# Patient Record
Sex: Female | Born: 1985 | Race: White | Hispanic: No | Marital: Married | Smoking: Never smoker
Health system: Southern US, Community
[De-identification: ages and names within clinical notes are randomized; demographics above are authoritative.]

## PROBLEM LIST (undated history)

## (undated) ENCOUNTER — Emergency Department (HOSPITAL_COMMUNITY): Admission: EM | Discharge status: Against Medical Advice | Payer: Self-pay

## (undated) ENCOUNTER — Inpatient Hospital Stay (HOSPITAL_COMMUNITY): Payer: Self-pay

## (undated) DIAGNOSIS — J45909 Unspecified asthma, uncomplicated: Secondary | ICD-10-CM

## (undated) DIAGNOSIS — O30043 Twin pregnancy, dichorionic/diamniotic, third trimester: Secondary | ICD-10-CM

## (undated) DIAGNOSIS — I10 Essential (primary) hypertension: Secondary | ICD-10-CM

## (undated) DIAGNOSIS — F419 Anxiety disorder, unspecified: Secondary | ICD-10-CM

## (undated) DIAGNOSIS — O09299 Supervision of pregnancy with other poor reproductive or obstetric history, unspecified trimester: Secondary | ICD-10-CM

## (undated) DIAGNOSIS — F32A Depression, unspecified: Secondary | ICD-10-CM

## (undated) DIAGNOSIS — F329 Major depressive disorder, single episode, unspecified: Secondary | ICD-10-CM

## (undated) DIAGNOSIS — Z98891 History of uterine scar from previous surgery: Secondary | ICD-10-CM

## (undated) DIAGNOSIS — R519 Headache, unspecified: Secondary | ICD-10-CM

## (undated) HISTORY — PX: WISDOM TOOTH EXTRACTION: SHX21

---

## 2008-09-07 ENCOUNTER — Ambulatory Visit: Payer: Self-pay | Admitting: Diagnostic Radiology

## 2008-09-07 ENCOUNTER — Ambulatory Visit (HOSPITAL_BASED_OUTPATIENT_CLINIC_OR_DEPARTMENT_OTHER): Admission: RE | Admit: 2008-09-07 | Discharge: 2008-09-07 | Payer: Self-pay | Admitting: Family Medicine

## 2009-04-29 HISTORY — PX: KNEE ARTHROSCOPY W/ MENISCAL REPAIR: SHX1877

## 2009-08-03 ENCOUNTER — Encounter: Admission: RE | Admit: 2009-08-03 | Discharge: 2009-08-28 | Payer: Self-pay | Admitting: Orthopaedic Surgery

## 2009-11-01 ENCOUNTER — Encounter: Admission: RE | Admit: 2009-11-01 | Discharge: 2009-12-08 | Payer: Self-pay | Admitting: Orthopaedic Surgery

## 2009-12-15 IMAGING — US US ABDOMEN COMPLETE
1 series · 14 of 25 positions shown · non-contrast
Comparison: None

CLINICAL DATA: Abdominal pain.  Postprandial.  Evaluate for
gallstones.

COMPLETE ABDOMINAL ULTRASOUND

[Series 1: us abdomen complete · 0.30mm/px · 14 of 68 slices shown]
[im 1/68]
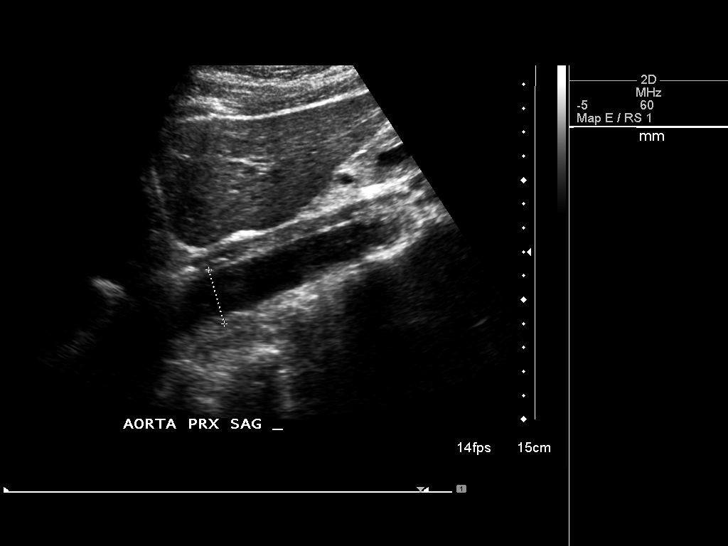
[im 6/68]
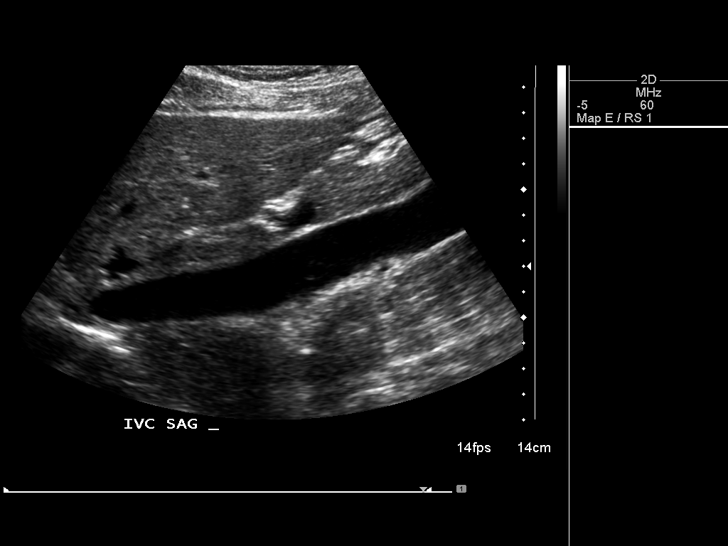
[im 12/68]
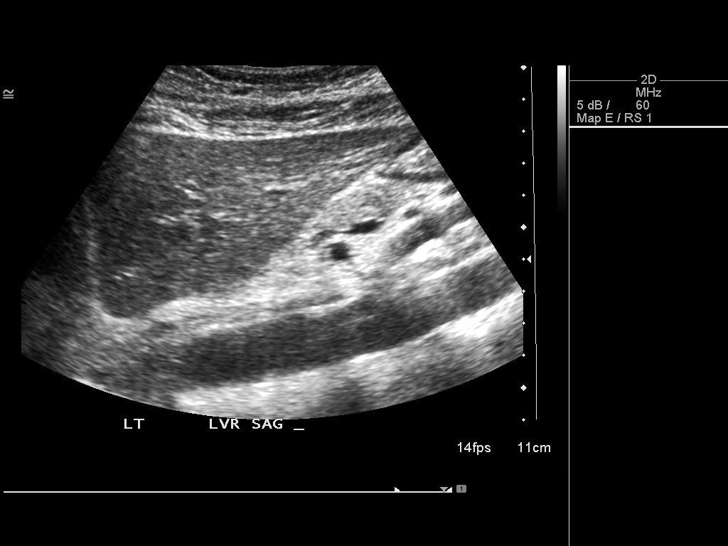
[im 17/68]
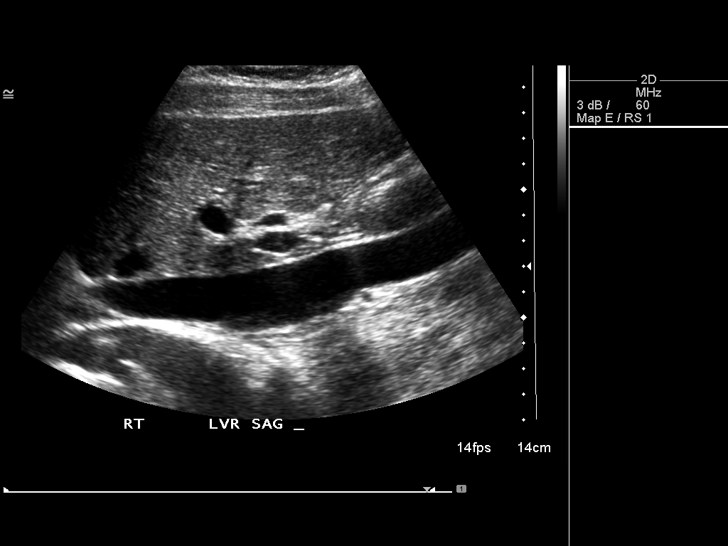
[im 23/68]
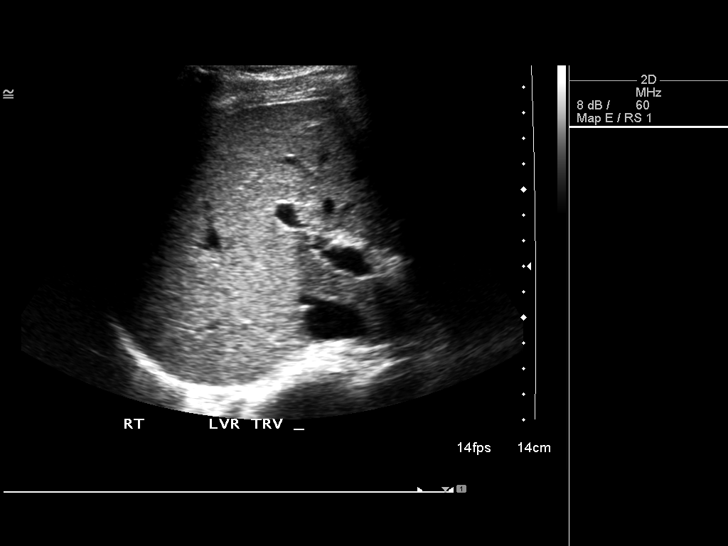
[im 26/68]
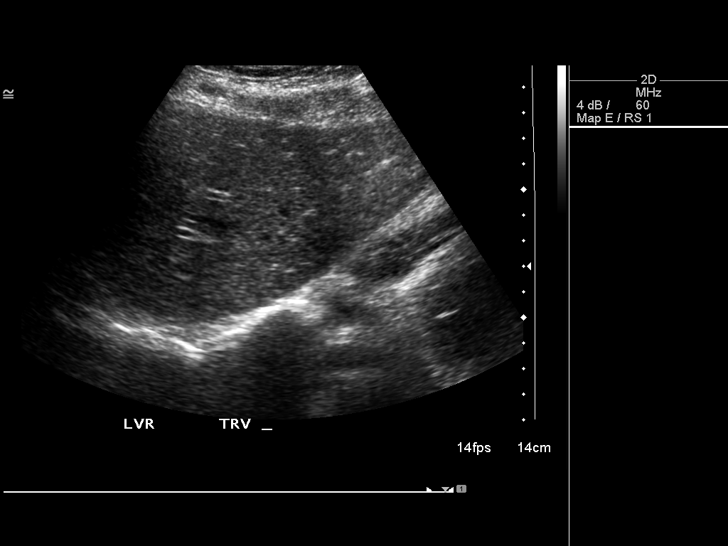
[im 31/68]
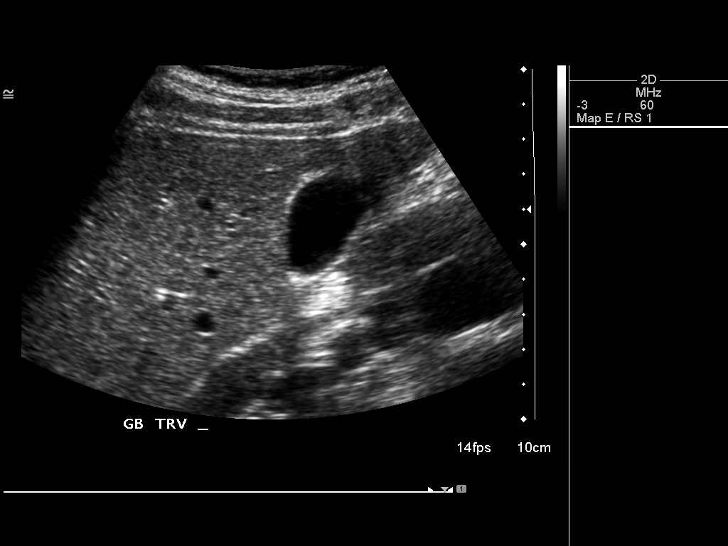
[im 37/68]
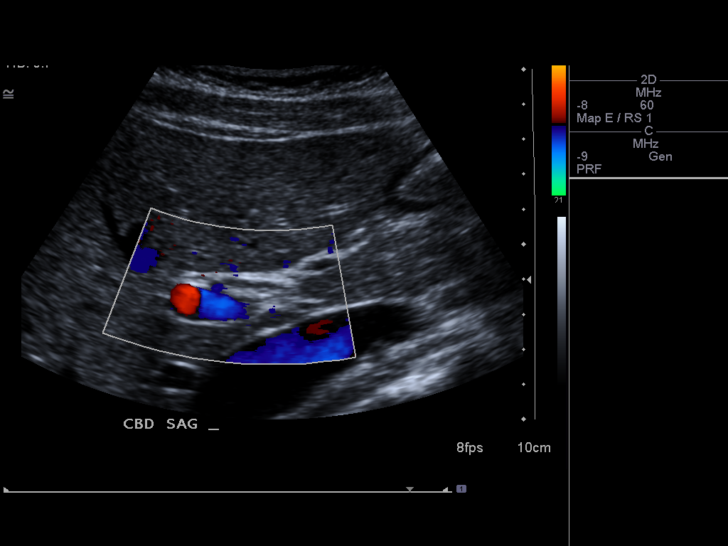
[im 42/68]
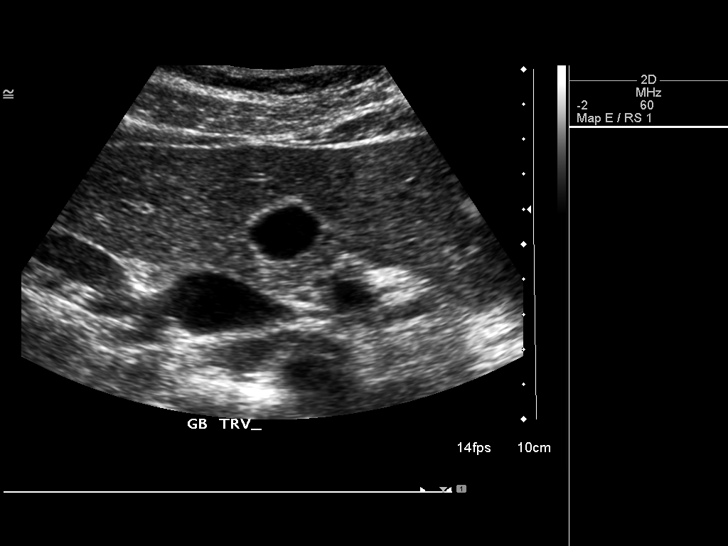
[im 45/68]
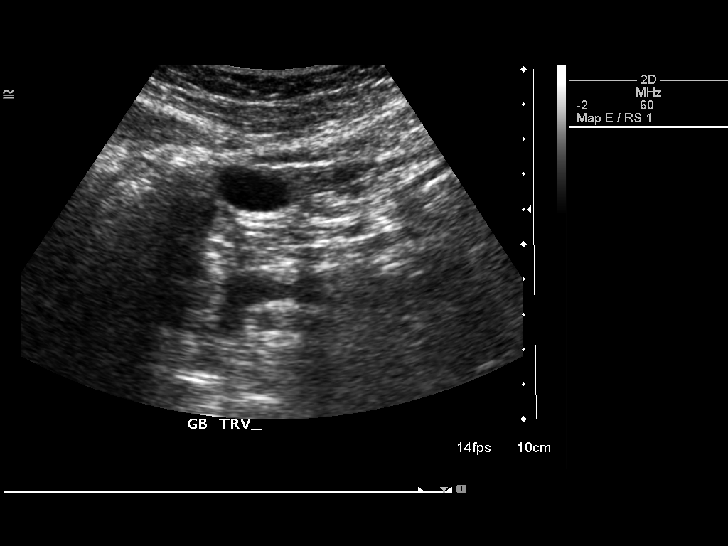
[im 51/68]
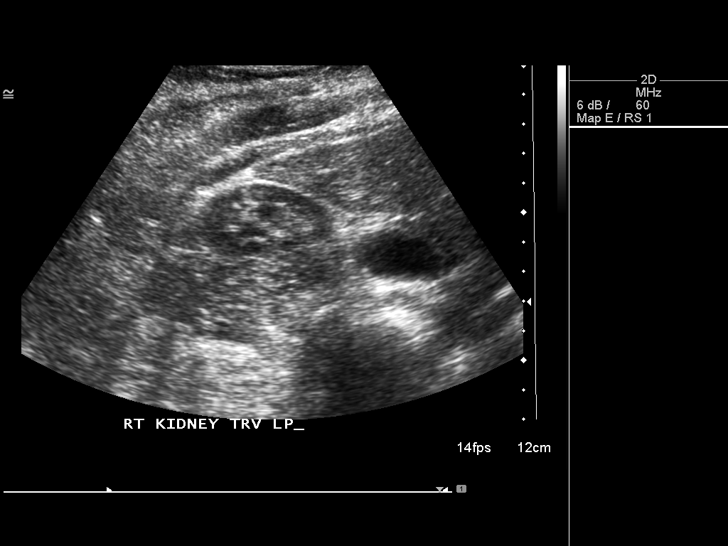
[im 56/68]
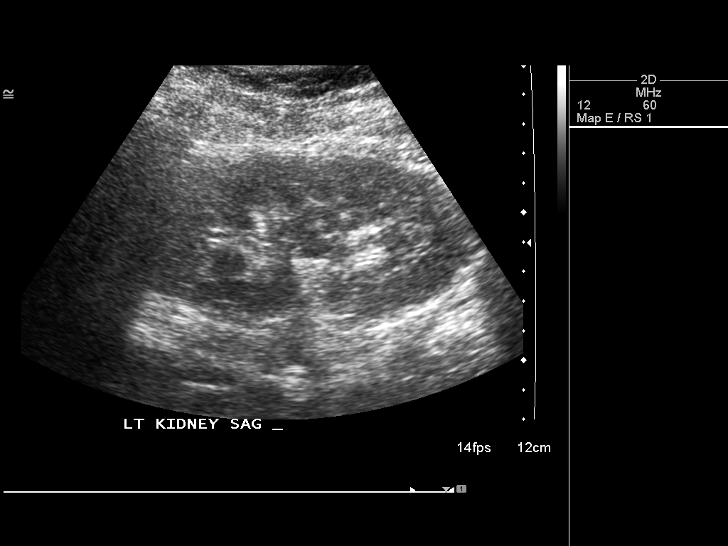
[im 62/68]
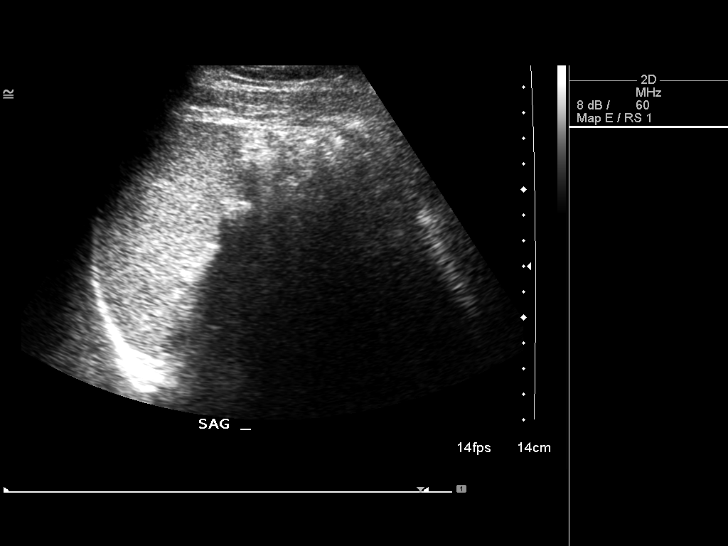
[im 68/68]
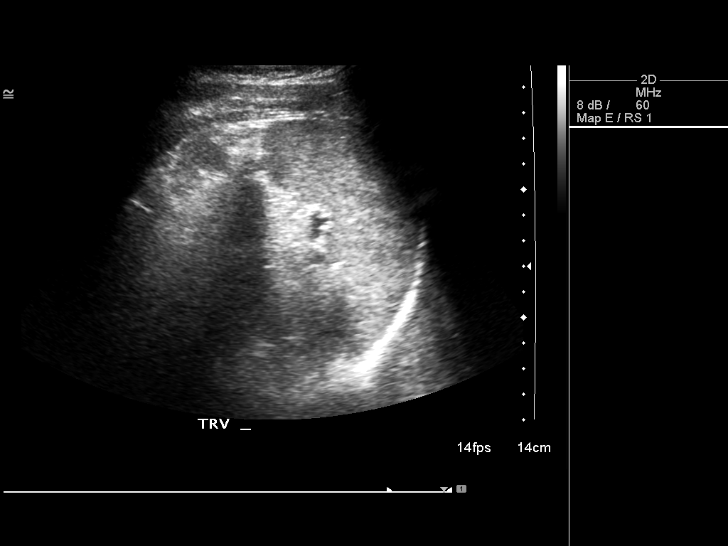

[14 of 25 positions shown; findings below may reference images not displayed]

FINDINGS: Gallbladder:  Normal, without wall thickening, stone, or
pericholecystic fluid.  Sonographic Murphy's sign was not elicited.

Common bile duct:  Normal, 3 mm.

Liver:  Normal in echogenicity, without focal lesion.

IVC:  Within normal limits.

Pancreas:  Within normal limits.

Spleen:  Normal in size and echogenicity.

Right Kidney:  10.6 cm.

Left Kidney:  10.8 cm.  No hydronephrosis.

Abdominal aorta:  Nonaneurysmal without ascites.

Other Findings:  None
IMPRESSION: Normal abdominal ultrasound.

## 2013-03-18 LAB — OB RESULTS CONSOLE HEPATITIS B SURFACE ANTIGEN: Hepatitis B Surface Ag: NEGATIVE

## 2013-03-18 LAB — OB RESULTS CONSOLE RUBELLA ANTIBODY, IGM: Rubella: IMMUNE

## 2013-03-18 LAB — OB RESULTS CONSOLE HIV ANTIBODY (ROUTINE TESTING): HIV: NONREACTIVE

## 2013-03-18 LAB — OB RESULTS CONSOLE ANTIBODY SCREEN: Antibody Screen: NEGATIVE

## 2013-03-18 LAB — OB RESULTS CONSOLE ABO/RH: RH TYPE: POSITIVE

## 2013-03-18 LAB — OB RESULTS CONSOLE RPR: RPR: NONREACTIVE

## 2013-05-15 ENCOUNTER — Encounter (HOSPITAL_COMMUNITY): Payer: Self-pay | Admitting: Family

## 2013-05-15 ENCOUNTER — Inpatient Hospital Stay (HOSPITAL_COMMUNITY)
Admission: AD | Admit: 2013-05-15 | Discharge: 2013-05-15 | Disposition: A | Discharge status: Home / Self Care | Payer: BC Managed Care – PPO | Source: Ambulatory Visit | Attending: Obstetrics and Gynecology | Admitting: Obstetrics and Gynecology

## 2013-05-15 DIAGNOSIS — N859 Noninflammatory disorder of uterus, unspecified: Secondary | ICD-10-CM

## 2013-05-15 DIAGNOSIS — O30049 Twin pregnancy, dichorionic/diamniotic, unspecified trimester: Secondary | ICD-10-CM

## 2013-05-15 DIAGNOSIS — O30009 Twin pregnancy, unspecified number of placenta and unspecified number of amniotic sacs, unspecified trimester: Secondary | ICD-10-CM | POA: Insufficient documentation

## 2013-05-15 DIAGNOSIS — N858 Other specified noninflammatory disorders of uterus: Secondary | ICD-10-CM

## 2013-05-15 DIAGNOSIS — R109 Unspecified abdominal pain: Secondary | ICD-10-CM | POA: Insufficient documentation

## 2013-05-15 DIAGNOSIS — O4700 False labor before 37 completed weeks of gestation, unspecified trimester: Secondary | ICD-10-CM | POA: Insufficient documentation

## 2013-05-15 HISTORY — DX: Unspecified asthma, uncomplicated: J45.909

## 2013-05-15 HISTORY — DX: Anxiety disorder, unspecified: F41.9

## 2013-05-15 HISTORY — DX: Depression, unspecified: F32.A

## 2013-05-15 HISTORY — DX: Essential (primary) hypertension: I10

## 2013-05-15 HISTORY — DX: Major depressive disorder, single episode, unspecified: F32.9

## 2013-05-15 LAB — URINALYSIS, ROUTINE W REFLEX MICROSCOPIC
BILIRUBIN URINE: NEGATIVE
Glucose, UA: NEGATIVE mg/dL
HGB URINE DIPSTICK: NEGATIVE
Ketones, ur: NEGATIVE mg/dL
NITRITE: NEGATIVE
PH: 7 (ref 5.0–8.0)
Protein, ur: NEGATIVE mg/dL
UROBILINOGEN UA: 0.2 mg/dL (ref 0.0–1.0)

## 2013-05-15 LAB — URINE MICROSCOPIC-ADD ON

## 2013-05-15 NOTE — MAU Provider Note (Signed)
  History     CSN: [id number]  Arrival date and time: 05/15/13 1335   First Provider Initiated Contact with Patient 05/15/13 1432      Chief Complaint  Patient presents with  . Abdominal Pain   HPI Comments: G2P0100 @ 31.5 wk with didi twins c/o cramping since 0500 this am. Reports more activity yesterday. No recent intercourse. Tried po hydration, warm bath, and rest with no effect. No VB, ROM, or ctx. Good FM. H/o CS @27  wks for PEC, Trisomy 13, baby died.   Abdominal Pain    OB History   Grav Para Term Preterm Abortions TAB SAB Ect Mult Living   2 1  1             Past Medical History  Diagnosis Date  . Hypertension     c/s 07/27/12 d/t preeclampsia  . Asthma   . Anxiety   . Depression     Prozac    Past Surgical History  Procedure Laterality Date  . Cesarean section  07/27/2012    d/t preeclampsia  . Wisdom tooth extraction    . Knee arthroscopy w/ meniscal repair Right 2011    Family History  Problem Relation Age of Onset  . Miscarriages / [place] Mother   . Asthma Mother   . Hypertension Mother   . Diabetes Maternal Aunt   . Hypertension Maternal Aunt   . Stroke Maternal Uncle   . Hypertension Maternal Uncle   . Stroke Paternal Uncle   . Diabetes Maternal Grandfather   . Stroke Paternal Grandmother   . Stroke Paternal Grandfather     History  Substance Use Topics  . Smoking status: Never Smoker   . Smokeless tobacco: Never Used  . Alcohol Use: Yes     Comment: occasional     Allergies: No Known Allergies  No prescriptions prior to admission    Review of Systems  Constitutional: Negative.   HENT: Negative.   Eyes: Negative.   Respiratory: Positive for wheezing.        Intermittent, uses steroid and rescue inhaler  Cardiovascular: Negative.   Gastrointestinal: Positive for abdominal pain.       Cramping  Genitourinary: Negative.   Musculoskeletal: Negative.   Skin: Negative.   Neurological: Negative.   Endo/Heme/Allergies:  Negative.   Psychiatric/Behavioral: Negative.    Physical Exam   Blood pressure 120/81, pulse 118, temperature 98.5 F (36.9 C), temperature source Oral, resp. rate 15.  Physical Exam  Constitutional: She is oriented to person, place, and time. She appears well-developed and well-nourished.  HENT:  Head: Normocephalic and atraumatic.  Eyes: Pupils are equal, round, and reactive to light.  Neck: Normal range of motion.  Cardiovascular: Normal rate and regular rhythm.   Respiratory: Effort normal and breath sounds normal.  GI: Soft. There is no tenderness.  Gravid  Genitourinary: Vagina normal.  SVE 0/70/-2  Musculoskeletal: Normal range of motion.  Neurological: She is alert and oriented to person, place, and time.  Skin: Skin is warm and dry.  Psychiatric: She has a normal mood and affect.  FHR: A- 135bpm, moderate variability, +accels, no decels B-140bpm, moderate variability, +accels, no decels NST-reactive x2  MAU Course  Procedures NST UA-WNL  MDM n/a  Assessment and Plan  31.5 week twin gestation Uterine irritability  Discharge home. PTL precautions. Maintain hydration and nutrition. Keep scheduled appt. On 05/18/13.  Assessment and plan discussed with Dr. [name] and agrees.  [name], N 05/15/2013, 2:51 PM

## 2013-05-15 NOTE — MAU Note (Signed)
28 yo, G2P1 at 1343w5d, presents to MAU with c/o abdominal pain, described as similar to menstrual cramps, since 0500 today. Reports that approximately 2 weeks ago she was treated for UTI for similar symptoms. Last BM today, normal.   Denies VB, LOF, HSV.

## 2013-05-26 ENCOUNTER — Other Ambulatory Visit: Payer: Self-pay | Admitting: Obstetrics and Gynecology

## 2013-06-08 ENCOUNTER — Encounter (HOSPITAL_COMMUNITY): Payer: Self-pay

## 2013-06-18 ENCOUNTER — Encounter (HOSPITAL_COMMUNITY): Payer: Self-pay

## 2013-06-20 ENCOUNTER — Inpatient Hospital Stay (HOSPITAL_COMMUNITY)
Admission: AD | Admit: 2013-06-20 | Discharge: 2013-06-20 | Disposition: A | Discharge status: Home / Self Care | Payer: BC Managed Care – PPO | Source: Ambulatory Visit | Attending: Obstetrics and Gynecology | Admitting: Obstetrics and Gynecology

## 2013-06-20 ENCOUNTER — Encounter (HOSPITAL_COMMUNITY): Payer: Self-pay | Admitting: Obstetrics and Gynecology

## 2013-06-20 DIAGNOSIS — IMO0001 Reserved for inherently not codable concepts without codable children: Secondary | ICD-10-CM

## 2013-06-20 DIAGNOSIS — R030 Elevated blood-pressure reading, without diagnosis of hypertension: Secondary | ICD-10-CM | POA: Insufficient documentation

## 2013-06-20 DIAGNOSIS — O99891 Other specified diseases and conditions complicating pregnancy: Secondary | ICD-10-CM | POA: Insufficient documentation

## 2013-06-20 DIAGNOSIS — O9989 Other specified diseases and conditions complicating pregnancy, childbirth and the puerperium: Principal | ICD-10-CM

## 2013-06-20 DIAGNOSIS — O09299 Supervision of pregnancy with other poor reproductive or obstetric history, unspecified trimester: Secondary | ICD-10-CM

## 2013-06-20 DIAGNOSIS — R51 Headache: Secondary | ICD-10-CM | POA: Insufficient documentation

## 2013-06-20 DIAGNOSIS — R519 Headache, unspecified: Secondary | ICD-10-CM

## 2013-06-20 DIAGNOSIS — O30043 Twin pregnancy, dichorionic/diamniotic, third trimester: Secondary | ICD-10-CM

## 2013-06-20 DIAGNOSIS — O30009 Twin pregnancy, unspecified number of placenta and unspecified number of amniotic sacs, unspecified trimester: Secondary | ICD-10-CM | POA: Insufficient documentation

## 2013-06-20 DIAGNOSIS — O30049 Twin pregnancy, dichorionic/diamniotic, unspecified trimester: Secondary | ICD-10-CM | POA: Insufficient documentation

## 2013-06-20 DIAGNOSIS — O26899 Other specified pregnancy related conditions, unspecified trimester: Secondary | ICD-10-CM

## 2013-06-20 HISTORY — DX: Twin pregnancy, dichorionic/diamniotic, third trimester: O30.043

## 2013-06-20 HISTORY — DX: Supervision of pregnancy with other poor reproductive or obstetric history, unspecified trimester: O09.299

## 2013-06-20 LAB — COMPREHENSIVE METABOLIC PANEL
ALBUMIN: 2.5 g/dL — AB (ref 3.5–5.2)
ALK PHOS: 227 U/L — AB (ref 39–117)
ALT: 17 U/L (ref 0–35)
AST: 17 U/L (ref 0–37)
BILIRUBIN TOTAL: 0.2 mg/dL — AB (ref 0.3–1.2)
BUN: 6 mg/dL (ref 6–23)
CHLORIDE: 102 meq/L (ref 96–112)
CO2: 19 mEq/L (ref 19–32)
Calcium: 9 mg/dL (ref 8.4–10.5)
Creatinine, Ser: 0.69 mg/dL (ref 0.50–1.10)
GFR calc Af Amer: >90 mL/min (ref >90)
GFR calc non Af Amer: >90 mL/min (ref >90)
Glucose, Bld: 122 mg/dL — ABNORMAL HIGH (ref 70–99)
POTASSIUM: 4 meq/L (ref 3.7–5.3)
SODIUM: 136 meq/L — AB (ref 137–147)
TOTAL PROTEIN: 6.4 g/dL (ref 6.0–8.3)

## 2013-06-20 LAB — PROTEIN / CREATININE RATIO, URINE
Creatinine, Urine: 30.5 mg/dL
Protein Creatinine Ratio: 0.13 (ref 0.00–0.15)
Total Protein, Urine: 4 mg/dL

## 2013-06-20 LAB — CBC
HEMATOCRIT: 35.5 % — AB (ref 36.0–46.0)
HEMOGLOBIN: 12.4 g/dL (ref 12.0–15.0)
MCH: 31.3 pg (ref 26.0–34.0)
MCHC: 34.9 g/dL (ref 30.0–36.0)
MCV: 89.6 fL (ref 78.0–100.0)
Platelets: 180 10*3/uL (ref 150–400)
RBC: 3.96 MIL/uL (ref 3.87–5.11)
RDW: 13.1 % (ref 11.5–15.5)
WBC: 7.5 10*3/uL (ref 4.0–10.5)

## 2013-06-20 LAB — URIC ACID: URIC ACID, SERUM: 6.6 mg/dL (ref 2.4–7.0)

## 2013-06-20 MED ORDER — DEXAMETHASONE SODIUM PHOSPHATE 10 MG/ML IJ SOLN
10.0000 mg | Freq: Once | INTRAMUSCULAR | Status: AC
  Administered 2013-06-20: 10 mg via INTRAVENOUS
  Filled 2013-06-20: qty 1

## 2013-06-20 MED ORDER — METOCLOPRAMIDE HCL 5 MG/ML IJ SOLN
10.0000 mg | Freq: Once | INTRAMUSCULAR | Status: AC
  Administered 2013-06-20: 10 mg via INTRAVENOUS
  Filled 2013-06-20: qty 2

## 2013-06-20 MED ORDER — DIPHENHYDRAMINE HCL 50 MG/ML IJ SOLN
25.0000 mg | Freq: Once | INTRAMUSCULAR | Status: AC
  Administered 2013-06-20: 25 mg via INTRAVENOUS
  Filled 2013-06-20: qty 1

## 2013-06-20 MED ORDER — LACTATED RINGERS IV BOLUS (SEPSIS)
1000.0000 mL | Freq: Once | INTRAVENOUS | Status: AC
  Administered 2013-06-20: 1000 mL via INTRAVENOUS

## 2013-06-20 NOTE — Discharge Instructions (Signed)
Headaches, Frequently Asked Questions °MIGRAINE HEADACHES °Q: What is migraine? What causes it? How can I treat it? °A: Generally, migraine headaches begin as a dull ache. Then they develop into a constant, throbbing, and pulsating pain. You may experience pain at the temples. You may experience pain at the front or back of one or both sides of the head. The pain is usually accompanied by a combination of: °· Nausea. °· Vomiting. °· Sensitivity to light and noise. °Some people (about 15%) experience an aura (see below) before an attack. The cause of migraine is believed to be chemical reactions in the brain. Treatment for migraine may include over-the-counter or prescription medications. It may also include self-help techniques. These include relaxation training and biofeedback.  °Q: What is an aura? °A: About 15% of people with migraine get an "aura". This is a sign of neurological symptoms that occur before a migraine headache. You may see wavy or jagged lines, dots, or flashing lights. You might experience tunnel vision or blind spots in one or both eyes. The aura can include visual or auditory hallucinations (something imagined). It may include disruptions in smell (such as strange odors), taste or touch. Other symptoms include: °· Numbness. °· A "pins and needles" sensation. °· Difficulty in recalling or speaking the correct word. °These neurological events may last as long as 60 minutes. These symptoms will fade as the headache begins. °Q: What is a trigger? °A: Certain physical or environmental factors can lead to or "trigger" a migraine. These include: °· Foods. °· Hormonal changes. °· Weather. °· Stress. °It is important to remember that triggers are different for everyone. To help prevent migraine attacks, you need to figure out which triggers affect you. Keep a headache diary. This is a good way to track triggers. The diary will help you talk to your healthcare professional about your condition. °Q: Does  weather affect migraines? °A: Bright sunshine, hot, humid conditions, and drastic changes in barometric pressure may lead to, or "trigger," a migraine attack in some people. But studies have shown that weather does not act as a trigger for everyone with migraines. °Q: What is the link between migraine and hormones? °A: Hormones start and regulate many of your body's functions. Hormones keep your body in balance within a constantly changing environment. The levels of hormones in your body are unbalanced at times. Examples are during menstruation, pregnancy, or menopause. That can lead to a migraine attack. In fact, about three quarters of all women with migraine report that their attacks are related to the menstrual cycle.  °Q: Is there an increased risk of stroke for migraine sufferers? °A: The likelihood of a migraine attack causing a stroke is very remote. That is not to say that migraine sufferers cannot have a stroke associated with their migraines. In persons under age 40, the most common associated factor for stroke is migraine headache. But over the course of a person's normal life span, the occurrence of migraine headache may actually be associated with a reduced risk of dying from cerebrovascular disease due to stroke.  °Q: What are acute medications for migraine? °A: Acute medications are used to treat the pain of the headache after it has started. Examples over-the-counter medications, NSAIDs, ergots, and triptans.  °Q: What are the triptans? °A: Triptans are the newest class of abortive medications. They are specifically targeted to treat migraine. Triptans are vasoconstrictors. They moderate some chemical reactions in the brain. The triptans work on receptors in your brain. Triptans help   to restore the balance of a neurotransmitter called serotonin. Fluctuations in levels of serotonin are thought to be a main cause of migraine.  °Q: Are over-the-counter medications for migraine effective? °A:  Over-the-counter, or "OTC," medications may be effective in relieving mild to moderate pain and associated symptoms of migraine. But you should see your caregiver before beginning any treatment regimen for migraine.  °Q: What are preventive medications for migraine? °A: Preventive medications for migraine are sometimes referred to as "prophylactic" treatments. They are used to reduce the frequency, severity, and length of migraine attacks. Examples of preventive medications include antiepileptic medications, antidepressants, beta-blockers, calcium channel blockers, and NSAIDs (nonsteroidal anti-inflammatory drugs). °Q: Why are anticonvulsants used to treat migraine? °A: During the past few years, there has been an increased interest in antiepileptic drugs for the prevention of migraine. They are sometimes referred to as "anticonvulsants". Both epilepsy and migraine may be caused by similar reactions in the brain.  °Q: Why are antidepressants used to treat migraine? °A: Antidepressants are typically used to treat people with depression. They may reduce migraine frequency by regulating chemical levels, such as serotonin, in the brain.  °Q: What alternative therapies are used to treat migraine? °A: The term "alternative therapies" is often used to describe treatments considered outside the scope of conventional Western medicine. Examples of alternative therapy include acupuncture, acupressure, and yoga. Another common alternative treatment is herbal therapy. Some herbs are believed to relieve headache pain. Always discuss alternative therapies with your caregiver before proceeding. Some herbal products contain arsenic and other toxins. °TENSION HEADACHES °Q: What is a tension-type headache? What causes it? How can I treat it? °A: Tension-type headaches occur randomly. They are often the result of temporary stress, anxiety, fatigue, or anger. Symptoms include soreness in your temples, a tightening band-like sensation  around your head (a "vice-like" ache). Symptoms can also include a pulling feeling, pressure sensations, and contracting head and neck muscles. The headache begins in your forehead, temples, or the back of your head and neck. Treatment for tension-type headache may include over-the-counter or prescription medications. Treatment may also include self-help techniques such as relaxation training and biofeedback. °CLUSTER HEADACHES °Q: What is a cluster headache? What causes it? How can I treat it? °A: Cluster headache gets its name because the attacks come in groups. The pain arrives with little, if any, warning. It is usually on one side of the head. A tearing or bloodshot eye and a runny nose on the same side of the headache may also accompany the pain. Cluster headaches are believed to be caused by chemical reactions in the brain. They have been described as the most severe and intense of any headache type. Treatment for cluster headache includes prescription medication and oxygen. °SINUS HEADACHES °Q: What is a sinus headache? What causes it? How can I treat it? °A: When a cavity in the bones of the face and skull (a sinus) becomes inflamed, the inflammation will cause localized pain. This condition is usually the result of an allergic reaction, a tumor, or an infection. If your headache is caused by a sinus blockage, such as an infection, you will probably have a fever. An x-ray will confirm a sinus blockage. Your caregiver's treatment might include antibiotics for the infection, as well as antihistamines or decongestants.  °REBOUND HEADACHES °Q: What is a rebound headache? What causes it? How can I treat it? °A: A pattern of taking acute headache medications too often can lead to a condition known as "rebound headache."   A pattern of taking too much headache medication includes taking it more than 2 days per week or in excessive amounts. That means more than the label or a caregiver advises. With rebound  headaches, your medications not only stop relieving pain, they actually begin to cause headaches. Doctors treat rebound headache by tapering the medication that is being overused. Sometimes your caregiver will gradually substitute a different type of treatment or medication. Stopping may be a challenge. Regularly overusing a medication increases the potential for serious side effects. Consult a caregiver if you regularly use headache medications more than 2 days per week or more than the label advises. ADDITIONAL QUESTIONS AND ANSWERS Q: What is biofeedback? A: Biofeedback is a self-help treatment. Biofeedback uses special equipment to monitor your body's involuntary physical responses. Biofeedback monitors:  Breathing.  Pulse.  Heart rate.  Temperature.  Muscle tension.  Brain activity. Biofeedback helps you refine and perfect your relaxation exercises. You learn to control the physical responses that are related to stress. Once the technique has been mastered, you do not need the equipment any more. Q: Are headaches hereditary? A: Four out of five (80%) of people that suffer report a family history of migraine. Scientists are not sure if this is genetic or a family predisposition. Despite the uncertainty, a child has a 50% chance of having migraine if one parent suffers. The child has a 75% chance if both parents suffer.  Q: Can children get headaches? A: By the time they reach high school, most young people have experienced some type of headache. Many safe and effective approaches or medications can prevent a headache from occurring or stop it after it has begun.  Q: What type of doctor should I see to diagnose and treat my headache? A: Start with your primary caregiver. Discuss his or her experience and approach to headaches. Discuss methods of classification, diagnosis, and treatment. Your caregiver may decide to recommend you to a headache specialist, depending upon your symptoms or other  physical conditions. Having diabetes, allergies, etc., may require a more comprehensive and inclusive approach to your headache. The National Head[employer] Foundation will provide, upon request, a list of [facility] physician members in your state. Document Released: 07/06/2003 Document Revised: 07/08/2011 Document Reviewed: 12/14/2007 [facility] Patient Information 2014 [place], [place]. Braxton Hicks Contractions Pregnancy is commonly associated with contractions of the uterus throughout the pregnancy. Towards the end of pregnancy (32 to 34 weeks), these contractions [facility] Willa Rough) can develop more often and may become more forceful. This is not true labor because these contractions do not result in opening (dilatation) and thinning of the cervix. They are sometimes difficult to tell apart from true labor because these contractions can be forceful and people have different pain tolerances. You should not feel embarrassed if you go to the hospital with false labor. Sometimes, the only way to tell if you are in true labor is for your caregiver to follow the changes in the cervix. How to tell the difference between true and false labor:  False labor.  The contractions of false labor are usually shorter, irregular and not as hard as those of true labor.  They are often felt in the front of the lower abdomen and in the groin.  They may leave with walking around or changing positions while lying down.  They get weaker and are shorter lasting as time goes on.  These contractions are usually irregular.  They do not usually become progressively stronger, regular and closer together as with  true labor.  True labor.  Contractions in true labor last 30 to 70 seconds, become very regular, usually become more intense, and increase in frequency.  They do not go away with walking.  The discomfort is usually felt in the top of the uterus and spreads to the lower abdomen and low back.  True labor can be determined by  your caregiver with an exam. This will show that the cervix is dilating and getting thinner. If there are no prenatal problems or other health problems associated with the pregnancy, it is completely safe to be sent home with false labor and await the onset of true labor. HOME CARE INSTRUCTIONS   Keep up with your usual exercises and instructions.  Take medications as directed.  Keep your regular prenatal appointment.  Eat and drink lightly if you think you are going into labor.  If BH contractions are making you uncomfortable:  Change your activity position from lying down or resting to walking/walking to resting.  Sit and rest in a tub of warm water.  Drink 2 to 3 glasses of water. Dehydration may cause B-H contractions.  Do slow and deep breathing several times an hour. SEEK IMMEDIATE MEDICAL CARE IF:   Your contractions continue to become stronger, more regular, and closer together.  You have a gushing, burst or leaking of fluid from the vagina.  An oral temperature above 102 F (38.9 C) develops.  You have passage of blood-tinged mucus.  You develop vaginal bleeding.  You develop continuous belly (abdominal) pain.  You have low back pain that you never had before.  You feel the baby's head pushing down causing pelvic pressure.  The baby is not moving as much as it used to. Document Released: 04/15/2005 Document Revised: 07/08/2011 Document Reviewed: 01/25/2013 [facility] Patient Information 2014 [place], [place]. Hypertension During Pregnancy Hypertension is also called high blood pressure. It can occur at any time in life and during pregnancy. When you have hypertension, there is extra pressure inside your blood vessels that carry blood from the heart to the rest of your body (arteries). Hypertension during pregnancy can cause problems for you and your baby. Your baby might not weigh as much as it should at birth or might be born early (premature). Very bad cases of  hypertension during pregnancy can be life threatening.  Different types of hypertension can occur during pregnancy.   Chronic hypertension. This happens when a woman has hypertension before pregnancy and it continues during pregnancy.  Gestational hypertension. This is when hypertension develops during pregnancy.  Preeclampsia or toxemia of pregnancy. This is a very serious type of hypertension that develops only during pregnancy. It is a disease that affects the whole body (systemic) and can be very dangerous for both mother and baby.  Gestational hypertension and preeclampsia usually go away after your baby is born. Blood pressure generally stabilizes within 6 weeks. Women who have hypertension during pregnancy have a greater chance of developing hypertension later in life or with future pregnancies. RISK FACTORS Some factors make you more likely to develop hypertension during pregnancy. Risk factors include:  Having hypertension before pregnancy.  Having hypertension during a previous pregnancy.  Being overweight.  Being older than 40.  Being pregnant with more than one baby (multiples).  Having diabetes or kidney problems. SIGNS AND SYMPTOMS Chronic and gestational hypertension rarely cause symptoms. Preeclampsia has symptoms, which may include:  Increased protein in your urine. Your health care provider will check for this at every prenatal visit.  Swelling of your hands and face.  Rapid weight gain.  Headaches.  Visual changes.  Being bothered by light.  Abdominal pain, especially in the right upper area.  Chest pain.  Shortness of breath.  Increased reflexes.  Seizures. Seizures occur with a more severe form of preeclampsia, called eclampsia. DIAGNOSIS   You may be diagnosed with hypertension during a regular prenatal exam. At each visit, tests may include:  Blood pressure checks.  A urine test to check for protein in your urine.  The type of  hypertension you are diagnosed with depends on when you developed it. It also depends on your specific blood pressure reading.  Developing hypertension before 20 weeks of pregnancy is consistent with chronic hypertension.  Developing hypertension after 20 weeks of pregnancy is consistent with gestational hypertension.  Hypertension with increased urinary protein is diagnosed as preeclampsia.  Blood pressure measurements that stay above 160 systolic or 110 diastolic are a sign of severe preeclampsia. TREATMENT Treatment for hypertension during pregnancy varies. Treatment depends on the type of hypertension and how serious it is.  If you take medicine for chronic hypertension, you may need to switch medicines.  Drugs called ACE inhibitors should not be taken during pregnancy.  Low-dose aspirin may be suggested for women who have risk factors for preeclampsia.  If you have gestational hypertension, you may need to take a blood pressure medicine that is safe during pregnancy. Your health care provider will recommend the appropriate medicine.  If you have severe preeclampsia, you may need to be in the hospital. Health care providers will watch you and the baby very closely. You also may need to take medicine (magnesium sulfate) to prevent seizures and lower blood pressure.  Sometimes an early delivery is needed. This may be the case if the condition worsens. It would be done to protect you and the baby. The only cure for preeclampsia is delivery. HOME CARE INSTRUCTIONS  Schedule and keep all of your regular appointments for prenatal care.  Only take over-the-counter or prescription medicines as directed by your health care provider. Tell your health care provider about all medicines you take.  Eat as little salt as possible.  Get regular exercise.  Do not drink alcohol.  Do not use tobacco products.  Do not drink products with caffeine.  Lie on your left side when resting. SEEK  IMMEDIATE MEDICAL CARE IF:  You have severe abdominal pain.  You have sudden swelling in the hands, ankles, or face.  You gain 4 pounds (1.8 kg) or more in 1 week.  You vomit repeatedly.  You have vaginal bleeding.  You do not feel the baby moving as much.  You have a headache.  You have blurred or double vision.  You have muscle twitching or spasms.  You have shortness of breath.  You have blue fingernails and lips.  You have blood in your urine. MAKE SURE YOU:  Understand these instructions.  Will watch your condition.  Will get help right away if you are not doing well or get worse. Document Released: 01/01/2011 Document Revised: 02/03/2013 Document Reviewed: 11/12/2012 [facility] Patient Information 2014 [place].

## 2013-06-20 NOTE — MAU Note (Addendum)
[age] yo, G2P0  di-di twins at 4841w6d, scheduled for c/s on 2/24, presents to MAU with c/o headache yesterday and today, unrelieved by Tylenol.  Hx significant for preterm labor and preE last pregnancy. Denies vision changes, although reports aura prior to HA onset yesterday.  Denies LOF, contractions, VB.

## 2013-06-20 NOTE — MAU Provider Note (Signed)
History     CSN: [id number]  Arrival date and time: 06/20/13 1227 Orders placed in EPIC: 1237 Provider notified: 1328 Provider at bedside: 1430     Chief Complaint  Patient presents with  . Headache   Headache  This is a recurrent problem. The current episode started today. The problem occurs intermittently. The problem has been unchanged. The pain does not radiate. The pain quality is similar to prior headaches. The quality of the pain is described as dull. The pain is moderate. The symptoms are aggravated by unknown. She has tried acetaminophen for the symptoms. The treatment provided no relief. (H/O severe PEC in prior pregnancy)   [name] is a 28 yo G2P0010 female at 64.[redacted]wks gestation presenting with a headache that is not relieved with Tylenol or caffeine and swelling of her hands and feet.  She has a h/o severe PEC resulting in a cesarean delivery at 27 wks.   That infant died d/t prematurity and Trisomy 52.  Her prenatal care has been complicated by her OB history and Di-di   twins.  Her primary care provider at [facility] is Dr. Billy Coast.  Past Medical History  Diagnosis Date  . Hypertension     c/s 07/27/12 d/t preeclampsia  . Asthma   . Anxiety   . Depression     Prozac  . Dichorionic diamniotic twin pregnancy in third trimester 06/20/2013  . H/O pre-eclampsia in prior pregnancy, currently pregnant 06/20/2013  . H/O pre-eclampsia in prior pregnancy, currently Di-Di twins pregnancy 06/20/2013    Past Surgical History  Procedure Laterality Date  . Cesarean section  07/27/2012    d/t preeclampsia  . Wisdom tooth extraction    . Knee arthroscopy w/ meniscal repair Right 2011    Family History  Problem Relation Age of Onset  . Miscarriages / [place] Mother   . Asthma Mother   . Hypertension Mother   . Diabetes Maternal Aunt   . Hypertension Maternal Aunt   . Stroke Maternal Uncle   . Hypertension Maternal Uncle   . Stroke Paternal Uncle   . Diabetes Maternal  Grandfather   . Stroke Paternal Grandmother   . Stroke Paternal Grandfather     History  Substance Use Topics  . Smoking status: Never Smoker   . Smokeless tobacco: Never Used  . Alcohol Use: Yes     Comment: occasional     Allergies: No Known Allergies  Prescriptions prior to admission  Medication Sig Dispense Refill  . albuterol (PROVENTIL HFA;VENTOLIN HFA) 108 (90 BASE) MCG/ACT inhaler Inhale 2 puffs into the lungs every 6 (six) hours as needed for wheezing or shortness of breath.      Marland Kitchen aspirin 81 MG chewable tablet Chew 81 mg by mouth daily.      . budesonide (PULMICORT) 180 MCG/ACT inhaler Inhale 1 puff into the lungs 2 (two) times daily.      . diphenhydrAMINE (BENADRYL) 25 MG tablet Take 25 mg by mouth every 6 (six) hours as needed for allergies.      Marland Kitchen FLUoxetine (PROZAC) 20 MG capsule Take 20 mg by mouth daily.      Marland Kitchen NIFEdipine (PROCARDIA) 10 MG capsule Take 10 mg by mouth daily as needed (contractions).      . Prenatal Vit-Fe Fumarate-FA (PRENATAL MULTIVITAMIN) TABS tablet Take 1 tablet by mouth daily at 12 noon.        Review of Systems  Constitutional: Negative.   Eyes: Negative.   Respiratory: Negative.   Cardiovascular:  Positive for leg swelling.       Hands & feet swelling  Gastrointestinal: Negative.        (+) FM x 2  Genitourinary: Negative.   Musculoskeletal: Negative.   Skin: Negative.   Neurological: Negative.   Endo/Heme/Allergies: Negative.   Psychiatric/Behavioral: Negative.    Results for orders placed during the hospital encounter of 06/20/13 (from the past 24 hour(s))  COMPREHENSIVE METABOLIC PANEL     Status: Abnormal   Collection Time    06/20/13 12:45 PM      Result Value Ref Range   Sodium 136 (*) 137 - 147 mEq/L   Potassium 4.0  3.7 - 5.3 mEq/L   Chloride 102  96 - 112 mEq/L   CO2 19  19 - 32 mEq/L   Glucose, Bld 122 (*) 70 - 99 mg/dL   BUN 6  6 - 23 mg/dL   Creatinine, Ser 1.610.69  0.50 - 1.10 mg/dL   Calcium 9.0  8.4 - 09.610.5 mg/dL    Total Protein 6.4  6.0 - 8.3 g/dL   Albumin 2.5 (*) 3.5 - 5.2 g/dL   AST 17  0 - 37 U/L   ALT 17  0 - 35 U/L   Alkaline Phosphatase 227 (*) 39 - 117 U/L   Total Bilirubin 0.2 (*) 0.3 - 1.2 mg/dL   GFR calc non Af Amer >90  >90 mL/min   GFR calc Af Amer >90  >90 mL/min  URIC ACID     Status: None   Collection Time    06/20/13 12:45 PM      Result Value Ref Range   Uric Acid, Serum 6.6  2.4 - 7.0 mg/dL  CBC     Status: Abnormal   Collection Time    06/20/13 12:45 PM      Result Value Ref Range   WBC 7.5  4.0 - 10.5 K/uL   RBC 3.96  3.87 - 5.11 MIL/uL   Hemoglobin 12.4  12.0 - 15.0 g/dL   HCT 04.535.5 (*) 40.936.0 - 81.146.0 %   MCV 89.6  78.0 - 100.0 fL   MCH 31.3  26.0 - 34.0 pg   MCHC 34.9  30.0 - 36.0 g/dL   RDW 91.413.1  78.211.5 - 95.615.5 %   Platelets 180  150 - 400 K/uL  PROTEIN / CREATININE RATIO, URINE     Status: None   Collection Time    06/20/13  1:00 PM      Result Value Ref Range   Creatinine, Urine 30.50     Total Protein, Urine 4     PROTEIN CREATININE RATIO 0.13  0.00 - 0.15   FHR (A): 155 moderate variability, accels present, no decels FHR (B): 160 moderate variability, accels present, no decels Toco: Occ UC's with UI, mild to palpation  Physical Exam   Blood pressure 116/90, pulse 111, temperature 98.3 F (36.8 C), temperature source Oral, resp. rate 16, SpO2 97.00%.  Physical Exam  Constitutional: She is oriented to person, place, and time. She appears well-developed and well-nourished.  HENT:  Head: Normocephalic and atraumatic.  Eyes: Conjunctivae are normal. Pupils are equal, round, and reactive to light.  photophobia  Neck: Normal range of motion. Neck supple.  Cardiovascular: Normal rate, regular rhythm and normal heart sounds.   Trace edema in hands and BLEs  Respiratory: Effort normal and breath sounds normal.  GI: Soft.  Gravid; hyperactive bowel sounds  Genitourinary: Uterus normal.  Pelvic exam deferred  Musculoskeletal: Normal range  of motion.   Neurological: She is alert and oriented to person, place, and time. She has normal reflexes.  Skin: Skin is warm and dry.  Psychiatric: She has a normal mood and affect. Her behavior is normal. Judgment and thought content normal.    MAU Course  Procedures CBC  CMET Uric Acid Urine Protein/Creatinine Ratio CEFM x 2 Serial BPs LR bolus Decadron 10mg  IVP Benadryl 25mg  IVP Reglan 10mg  IVP  Assessment and Plan  Di-Di twin gestation @ 36.6 wks  Elevated BPs Headache - resolved with IVF & IV meds H/O Severe PEC H/O PTD @ 27 wks - infant died d/t prematurity and Trisomy 13  Discharge Home Maintain good hydration Keep Pre-op appointment 06/21/13 PIH instructions given Tylenol 1000 mg prn headache Labor precautions given Scheduled C/S 06/22/13 Call the office, if symptoms worsen  *Dr. Billy Coast notified *Dr. Cherly Hensen notified of assessment and plan - agrees  Kenard Gower, MSN, CNM 06/20/2013, 2:34 PM

## 2013-06-21 ENCOUNTER — Encounter (HOSPITAL_COMMUNITY): Payer: Self-pay | Admitting: *Deleted

## 2013-06-21 ENCOUNTER — Inpatient Hospital Stay (HOSPITAL_COMMUNITY): Admission: RE | Admit: 2013-06-21 | Payer: BC Managed Care – PPO | Source: Ambulatory Visit

## 2013-06-21 ENCOUNTER — Inpatient Hospital Stay (HOSPITAL_COMMUNITY)
Admission: AD | Admit: 2013-06-21 | Discharge: 2013-06-21 | Disposition: A | Discharge status: Home / Self Care | Payer: BC Managed Care – PPO | Source: Ambulatory Visit | Attending: Obstetrics and Gynecology | Admitting: Obstetrics and Gynecology

## 2013-06-21 DIAGNOSIS — O99891 Other specified diseases and conditions complicating pregnancy: Secondary | ICD-10-CM | POA: Insufficient documentation

## 2013-06-21 DIAGNOSIS — O09299 Supervision of pregnancy with other poor reproductive or obstetric history, unspecified trimester: Secondary | ICD-10-CM

## 2013-06-21 DIAGNOSIS — O30043 Twin pregnancy, dichorionic/diamniotic, third trimester: Secondary | ICD-10-CM

## 2013-06-21 DIAGNOSIS — O30049 Twin pregnancy, dichorionic/diamniotic, unspecified trimester: Secondary | ICD-10-CM | POA: Insufficient documentation

## 2013-06-21 DIAGNOSIS — R030 Elevated blood-pressure reading, without diagnosis of hypertension: Secondary | ICD-10-CM | POA: Insufficient documentation

## 2013-06-21 DIAGNOSIS — O9989 Other specified diseases and conditions complicating pregnancy, childbirth and the puerperium: Principal | ICD-10-CM

## 2013-06-21 DIAGNOSIS — R51 Headache: Secondary | ICD-10-CM | POA: Insufficient documentation

## 2013-06-21 DIAGNOSIS — O30009 Twin pregnancy, unspecified number of placenta and unspecified number of amniotic sacs, unspecified trimester: Secondary | ICD-10-CM | POA: Insufficient documentation

## 2013-06-21 LAB — COMPREHENSIVE METABOLIC PANEL
ALBUMIN: 2.6 g/dL — AB (ref 3.5–5.2)
ALT: 18 U/L (ref 0–35)
AST: 20 U/L (ref 0–37)
Alkaline Phosphatase: 228 U/L — ABNORMAL HIGH (ref 39–117)
BUN: 6 mg/dL (ref 6–23)
CALCIUM: 9.8 mg/dL (ref 8.4–10.5)
CO2: 20 mEq/L (ref 19–32)
CREATININE: 0.69 mg/dL (ref 0.50–1.10)
Chloride: 102 mEq/L (ref 96–112)
GFR calc Af Amer: >90 mL/min (ref >90)
GFR calc non Af Amer: >90 mL/min (ref >90)
Glucose, Bld: 103 mg/dL — ABNORMAL HIGH (ref 70–99)
Potassium: 4.9 mEq/L (ref 3.7–5.3)
Sodium: 135 mEq/L — ABNORMAL LOW (ref 137–147)
Total Bilirubin: 0.4 mg/dL (ref 0.3–1.2)
Total Protein: 6.2 g/dL (ref 6.0–8.3)

## 2013-06-21 LAB — URINE MICROSCOPIC-ADD ON

## 2013-06-21 LAB — CBC
HCT: 34.5 % — ABNORMAL LOW (ref 36.0–46.0)
Hemoglobin: 12.3 g/dL (ref 12.0–15.0)
MCH: 31.8 pg (ref 26.0–34.0)
MCHC: 35.7 g/dL (ref 30.0–36.0)
MCV: 89.1 fL (ref 78.0–100.0)
Platelets: 180 10*3/uL (ref 150–400)
RBC: 3.87 MIL/uL (ref 3.87–5.11)
RDW: 13.1 % (ref 11.5–15.5)
WBC: 11.7 10*3/uL — ABNORMAL HIGH (ref 4.0–10.5)

## 2013-06-21 LAB — URINALYSIS, ROUTINE W REFLEX MICROSCOPIC
Bilirubin Urine: NEGATIVE
GLUCOSE, UA: NEGATIVE mg/dL
HGB URINE DIPSTICK: NEGATIVE
Ketones, ur: NEGATIVE mg/dL
Nitrite: NEGATIVE
Protein, ur: NEGATIVE mg/dL
SPECIFIC GRAVITY, URINE: 1.01 (ref 1.005–1.030)
UROBILINOGEN UA: 0.2 mg/dL (ref 0.0–1.0)
pH: 6 (ref 5.0–8.0)

## 2013-06-21 LAB — URIC ACID: URIC ACID, SERUM: 6.1 mg/dL (ref 2.4–7.0)

## 2013-06-21 MED ORDER — METOCLOPRAMIDE HCL 5 MG/ML IJ SOLN
10.0000 mg | Freq: Once | INTRAMUSCULAR | Status: AC
  Administered 2013-06-21: 10 mg via INTRAVENOUS
  Filled 2013-06-21: qty 2

## 2013-06-21 MED ORDER — LACTATED RINGERS IV BOLUS (SEPSIS)
1000.0000 mL | Freq: Once | INTRAVENOUS | Status: AC
  Administered 2013-06-21: 1000 mL via INTRAVENOUS

## 2013-06-21 MED ORDER — DIPHENHYDRAMINE HCL 50 MG/ML IJ SOLN
25.0000 mg | Freq: Once | INTRAMUSCULAR | Status: AC
  Administered 2013-06-21: 05:00:00 via INTRAVENOUS
  Filled 2013-06-21: qty 1

## 2013-06-21 MED ORDER — OXYCODONE-ACETAMINOPHEN 5-325 MG PO TABS
2.0000 | ORAL_TABLET | Freq: Once | ORAL | Status: DC
  Filled 2013-06-21: qty 2

## 2013-06-21 MED ORDER — DEXAMETHASONE SODIUM PHOSPHATE 10 MG/ML IJ SOLN
10.0000 mg | Freq: Once | INTRAMUSCULAR | Status: AC
  Administered 2013-06-21: 10 mg via INTRAVENOUS
  Filled 2013-06-21: qty 1

## 2013-06-21 NOTE — MAU Provider Note (Signed)
History     CSN: [id number]  Arrival date and time: 06/21/13 0330 Orders placed in EPIC: [id number] Notified MAU RN: [phone] Provider notified: 0425 Provider at bedside: 0700    Chief Complaint  Patient presents with  . Headache   Headache  This is a recurrent problem. The current episode started today. The problem occurs intermittently. The problem has been unchanged. The pain does not radiate. The pain quality is similar to prior headaches. The quality of the pain is described as dull. The pain is moderate. The symptoms are aggravated by unknown. She has tried acetaminophen for the symptoms. The treatment provided no relief. (H/O severe PEC in prior pregnancy)   Ms. Catalina AntiguaJessica Scholle is a 28 yo G2P0010 female at 37.[redacted] wks gestation presenting with a headache that is not relieved with Tylenol.   She has a h/o severe PEC resulting in a cesarean delivery at 27 wks.  That infant died d/t prematurity and Trisomy 3613.   Her prenatal care has been complicated by her OB history and Di-di twins.  [facility] [name].  Past Medical History  Diagnosis Date  . Hypertension     c/s 07/27/12 d/t preeclampsia  . Asthma   . Anxiety   . Depression     Prozac  . Dichorionic diamniotic twin pregnancy in third trimester 06/20/2013  . H/O pre-eclampsia in prior pregnancy, currently pregnant 06/20/2013  . H/O pre-eclampsia in prior pregnancy, currently Di-Di twins pregnancy 06/20/2013    Past Surgical History  Procedure Laterality Date  . Cesarean section  07/27/2012    d/t preeclampsia  . Wisdom tooth extraction    . Knee arthroscopy w/ meniscal repair Right 2011    Family History  Problem Relation Age of Onset  . Miscarriages / [place] Mother   . Asthma Mother   . Hypertension Mother   . Diabetes Maternal Aunt   . Hypertension Maternal Aunt   . Stroke Maternal Uncle   . Hypertension Maternal Uncle   . Stroke Paternal Uncle   . Diabetes Maternal Grandfather   . Stroke  Paternal Grandmother   . Stroke Paternal Grandfather     History  Substance Use Topics  . Smoking status: Never Smoker   . Smokeless tobacco: Never Used  . Alcohol Use: Yes     Comment: occasional     Allergies: No Known Allergies  Prescriptions prior to admission  Medication Sig Dispense Refill  . albuterol (PROVENTIL HFA;VENTOLIN HFA) 108 (90 BASE) MCG/ACT inhaler Inhale 2 puffs into the lungs every 6 (six) hours as needed for wheezing or shortness of breath.      Marland Kitchen. aspirin 81 MG chewable tablet Chew 81 mg by mouth daily.      . budesonide (PULMICORT) 180 MCG/ACT inhaler Inhale 1 puff into the lungs 2 (two) times daily.      . diphenhydrAMINE (BENADRYL) 25 MG tablet Take 25 mg by mouth every 6 (six) hours as needed for allergies.      Marland Kitchen. FLUoxetine (PROZAC) 20 MG capsule Take 20 mg by mouth daily.      Marland Kitchen. NIFEdipine (PROCARDIA) 10 MG capsule Take 10 mg by mouth daily as needed (contractions).      . Prenatal Vit-Fe Fumarate-FA (PRENATAL MULTIVITAMIN) TABS tablet Take 1 tablet by mouth daily at 12 noon.        Review of Systems  Constitutional: Negative.   Eyes: Negative.   Respiratory: Negative.   Cardiovascular: Positive for leg swelling.  Hands & feet swelling  Gastrointestinal: Negative.        (+) FM x 2  Genitourinary: Negative.   Musculoskeletal: Negative.   Skin: Negative.   Neurological: Positive for headaches.  Endo/Heme/Allergies: Negative.   Psychiatric/Behavioral: Negative.    Results for orders placed during the hospital encounter of 06/21/13 (from the past 24 hour(s))  CBC     Status: Abnormal   Collection Time    06/21/13  3:40 AM      Result Value Ref Range   WBC 11.7 (*) 4.0 - 10.5 K/uL   RBC 3.87  3.87 - 5.11 MIL/uL   Hemoglobin 12.3  12.0 - 15.0 g/dL   HCT 16.1 (*) 09.6 - 04.5 %   MCV 89.1  78.0 - 100.0 fL   MCH 31.8  26.0 - 34.0 pg   MCHC 35.7  30.0 - 36.0 g/dL   RDW 40.9  81.1 - 91.4 %   Platelets 180  150 - 400 K/uL  URIC ACID      Status: None   Collection Time    06/21/13  3:40 AM      Result Value Ref Range   Uric Acid, Serum 6.1  2.4 - 7.0 mg/dL  COMPREHENSIVE METABOLIC PANEL     Status: Abnormal   Collection Time    06/21/13  3:40 AM      Result Value Ref Range   Sodium 135 (*) 137 - 147 mEq/L   Potassium 4.9  3.7 - 5.3 mEq/L   Chloride 102  96 - 112 mEq/L   CO2 20  19 - 32 mEq/L   Glucose, Bld 103 (*) 70 - 99 mg/dL   BUN 6  6 - 23 mg/dL   Creatinine, Ser 7.82  0.50 - 1.10 mg/dL   Calcium 9.8  8.4 - 95.6 mg/dL   Total Protein 6.2  6.0 - 8.3 g/dL   Albumin 2.6 (*) 3.5 - 5.2 g/dL   AST 20  0 - 37 U/L   ALT 18  0 - 35 U/L   Alkaline Phosphatase 228 (*) 39 - 117 U/L   Total Bilirubin 0.4  0.3 - 1.2 mg/dL   GFR calc non Af Amer >90  >90 mL/min   GFR calc Af Amer >90  >90 mL/min  URINALYSIS, ROUTINE W REFLEX MICROSCOPIC     Status: Abnormal   Collection Time    06/21/13  4:11 AM      Result Value Ref Range   Color, Urine YELLOW  YELLOW   APPearance CLEAR  CLEAR   Specific Gravity, Urine 1.010  1.005 - 1.030   pH 6.0  5.0 - 8.0   Glucose, UA NEGATIVE  NEGATIVE mg/dL   Hgb urine dipstick NEGATIVE  NEGATIVE   Bilirubin Urine NEGATIVE  NEGATIVE   Ketones, ur NEGATIVE  NEGATIVE mg/dL   Protein, ur NEGATIVE  NEGATIVE mg/dL   Urobilinogen, UA 0.2  0.0 - 1.0 mg/dL   Nitrite NEGATIVE  NEGATIVE   Leukocytes, UA MODERATE (*) NEGATIVE  URINE MICROSCOPIC-ADD ON     Status: Abnormal   Collection Time    06/21/13  4:11 AM      Result Value Ref Range   Squamous Epithelial / LPF RARE  RARE   WBC, UA 3-6  <3 WBC/hpf   RBC / HPF 0-2  <3 RBC/hpf   Bacteria, UA FEW (*) RARE   FHR (A): 155 moderate variability, accels present, no decels FHR (B): 160 moderate variability, accels present, no decels  Toco: Occ UC's with UI, mild to palpation  Physical Exam   Blood pressure 118/79, pulse 98, temperature 97.7 F (36.5 C), resp. rate 20, height 5\' 7"  (1.702 m), weight 116.212 kg (256 lb 3.2 oz).  Physical Exam   Constitutional: She is oriented to person, place, and time. She appears well-developed and well-nourished.  HENT:  Head: Normocephalic and atraumatic.  Eyes: Conjunctivae are normal. Pupils are equal, round, and reactive to light.  photophobia  Neck: Normal range of motion. Neck supple.  Cardiovascular: Normal rate, regular rhythm and normal heart sounds.   Trace edema in hands and BLEs  Respiratory: Effort normal and breath sounds normal.  GI: Soft.  Gravid; hyperactive bowel sounds  Genitourinary: Uterus normal.  Pelvic exam deferred  Musculoskeletal: Normal range of motion.  Neurological: She is alert and oriented to person, place, and time. She has normal reflexes.  Skin: Skin is warm and dry.  Psychiatric: She has a normal mood and affect. Her behavior is normal. Judgment and thought content normal.    MAU Course  Procedures CBC  CMET Uric Acid CEFM x 2 Serial BPs LR bolus Decadron 10mg  IVP Benadryl 25mg  IVP Reglan 10mg  IVP  Assessment and Plan  Di-Di twin gestation @ 37.0 wks  Elevated BPs Headache - resolved with IVF & IV meds H/O Severe PEC H/O PTD @ 27 wks - infant died d/t prematurity and Trisomy 13  Discharge Home Maintain good hydration PIH instructions given Tylenol 1000 mg prn headache Labor precautions given Scheduled C/S 06/22/13 Call the office, if symptoms worsen  *Dr. Billy Coast notified of assessment and plan - agrees  Kenard Gower, MSN, CNM 06/21/2013, 7:12 AM

## 2013-06-21 NOTE — MAU Note (Signed)
Pt 37wks with twins, returns to MAU for headache.  Took tylenol at home with no relief.

## 2013-06-21 NOTE — Discharge Instructions (Signed)
Tension Headache A tension headache is a feeling of pain, pressure, or aching often felt over the front and sides of the head. The pain can be dull or can feel tight (constricting). It is the most common type of headache. Tension headaches are not normally associated with nausea or vomiting and do not get worse with physical activity. Tension headaches can last 30 minutes to several days.  CAUSES  The exact cause is not known, but it may be caused by chemicals and hormones in the brain that lead to pain. Tension headaches often begin after stress, anxiety, or depression. Other triggers may include:  Alcohol.  Caffeine (too much or withdrawal).  Respiratory infections (colds, flu, sinus infections).  Dental problems or teeth clenching.  Fatigue.  Holding your head and neck in one position too long while using a computer. SYMPTOMS   Pressure around the head.   Dull, aching head pain.   Pain felt over the front and sides of the head.   Tenderness in the muscles of the head, neck, and shoulders. DIAGNOSIS  A tension headache is often diagnosed based on:   Symptoms.   Physical examination.   A CT scan or MRI of your head. These tests may be ordered if symptoms are severe or unusual. TREATMENT  Medicines may be given to help relieve symptoms.  HOME CARE INSTRUCTIONS   Only take over-the-counter or prescription medicines for pain or discomfort as directed by your caregiver.   Lie down in a dark, quiet room when you have a headache.   Keep a journal to find out what may be triggering your headaches. For example, write down:  What you eat and drink.  How much sleep you get.  Any change to your diet or medicines.  Try massage or other relaxation techniques.   Ice packs or heat applied to the head and neck can be used. Use these 3 to 4 times per day for 15 to 20 minutes each time, or as needed.   Limit stress.   Sit up straight, and do not tense your muscles.    Quit smoking if you smoke.  Limit alcohol use.  Decrease the amount of caffeine you drink, or stop drinking caffeine.  Eat and exercise regularly.  Get 7 to 9 hours of sleep, or as recommended by your caregiver.  Avoid excessive use of pain medicine as recurrent headaches can occur.  SEEK MEDICAL CARE IF:   You have problems with the medicines you were prescribed.  Your medicines do not work.  You have a change from the usual headache.  You have nausea or vomiting. SEEK IMMEDIATE MEDICAL CARE IF:   Your headache becomes severe.  You have a fever.  You have a stiff neck.  You have loss of vision.  You have muscular weakness or loss of muscle control.  You lose your balance or have trouble walking.  You feel faint or pass out.  You have severe symptoms that are different from your first symptoms. MAKE SURE YOU:   Understand these instructions.  Will watch your condition.  Will get help right away if you are not doing well or get worse. Document Released: 04/15/2005 Document Revised: 07/08/2011 Document Reviewed: 04/05/2011 [facility] Patient Information 2014 [place], [place]. Braxton Hicks Contractions Pregnancy is commonly associated with contractions of the uterus throughout the pregnancy. Towards the end of pregnancy (32 to 34 weeks), these contractions [facility](Braxton [facility]illa [facility]) can develop more often and may become more forceful. This is not true labor  because these contractions do not result in opening (dilatation) and thinning of the cervix. They are sometimes difficult to tell apart from true labor because these contractions can be forceful and people have different pain tolerances. You should not feel embarrassed if you go to the hospital with false labor. Sometimes, the only way to tell if you are in true labor is for your caregiver to follow the changes in the cervix. How to tell the difference between true and false labor:  False labor.  The contractions of  false labor are usually shorter, irregular and not as hard as those of true labor.  They are often felt in the front of the lower abdomen and in the groin.  They may leave with walking around or changing positions while lying down.  They get weaker and are shorter lasting as time goes on.  These contractions are usually irregular.  They do not usually become progressively stronger, regular and closer together as with true labor.  True labor.  Contractions in true labor last 30 to 70 seconds, become very regular, usually become more intense, and increase in frequency.  They do not go away with walking.  The discomfort is usually felt in the top of the uterus and spreads to the lower abdomen and low back.  True labor can be determined by your caregiver with an exam. This will show that the cervix is dilating and getting thinner. If there are no prenatal problems or other health problems associated with the pregnancy, it is completely safe to be sent home with false labor and await the onset of true labor. HOME CARE INSTRUCTIONS   Keep up with your usual exercises and instructions.  Take medications as directed.  Keep your regular prenatal appointment.  Eat and drink lightly if you think you are going into labor.  If BH contractions are making you uncomfortable:  Change your activity position from lying down or resting to walking/walking to resting.  Sit and rest in a tub of warm water.  Drink 2 to 3 glasses of water. Dehydration may cause B-H contractions.  Do slow and deep breathing several times an hour. SEEK IMMEDIATE MEDICAL CARE IF:   Your contractions continue to become stronger, more regular, and closer together.  You have a gushing, burst or leaking of fluid from the vagina.  An oral temperature above 102 F (38.9 C) develops.  You have passage of blood-tinged mucus.  You develop vaginal bleeding.  You develop continuous belly (abdominal) pain.  You have  low back pain that you never had before.  You feel the baby's head pushing down causing pelvic pressure.  The baby is not moving as much as it used to. Document Released: 04/15/2005 Document Revised: 07/08/2011 Document Reviewed: 01/25/2013 [facility] Patient Information 2014 [place], [place].

## 2013-06-21 NOTE — H&P (Signed)
NAMCatalina Antigua:  Stephens, [name]                 ACCOUNT NO.:  [id number]  MEDICAL RECORD NO.:  [id number]  LOCATION:  PERIO                         FACILITY:  [facility]  PHYSICIAN:  Lenoard Adenichard J. Dareon Nunziato, M.D.DATE OF BIRTH:  [birth year]  DATE OF ADMISSION:  05/15/2013 DATE OF DISCHARGE:                             HISTORY & PHYSICAL   CHIEF COMPLAINT:  Repeat C-section for twins at 37 weeks.  HISTORY OF PRESENT ILLNESS:  This is a 28 year old white female, G2, P0, at 5337 weeks gestation, who presents for repeat low segment transverse cesarean section.  MEDICATIONS:  Include Pulmicort inhaler as needed, Benadryl as needed, prenatal vitamins, baby aspirin, and Prozac.  ALLERGIES:  She has allergies to adhesive bandages.  SOCIAL HISTORY:  She is a nonsmoker, nondrinker.  She denies domestic or physical violence.  FAMILY HISTORY:  Remarkable for stomach cancer, cerebrovascular disease, lung cancer, and diabetes with previous pregnancy delivered at 27 weeks with an aneuploid fetus, which died due to prematurity and tracks in the 4418.  PREVIOUS SURGICAL HISTORY:  Remarkable for C-section as noted, knee surgery, and wisdom tooth surgery.  PRENATAL COURSE:  Complicated by intermittent hypertension, preterm labor.  Group B strep was negative.  PHYSICAL EXAMINATION:  GENERAL:  She is a well-developed, well- nourished, white female, in no acute distress. HEENT:  Normal. NECK:  Supple.  Full range of motion. LUNGS:  Clear. HEART:  Reveals a regular rate and rhythm. ABDOMEN:  Soft, gravid, nontender.  Previous estimated fetal weight on June 08, 2013, 6 pounds and 6 ounces and 6 pounds and 4 ounces. Cervix is 2 cm, 70%, vertex, -1. EXTREMITIES:  There are no cords. NEUROLOGIC:  Nonfocal. SKIN:  Intact.  IMPRESSION: 1. A 37-week intrauterine pregnancy. 2. Previous cesarean section for repeat. 3. Dichorionic diamniotic twin gestation with concordant growth.  PLAN:  Proceed with repeat low-segment  transverse cesarean section. Risks of anesthesia, infection, bleeding, injury to surrounding organs, possible need for repair were discussed, delayed versus immediate complications to include bowel and bladder injury noted.  The patient acknowledges and wishes to proceed.     Lenoard Adenichard J. Carlen Fils, M.D.     RJT/MEDQ  D:  06/21/2013  T:  06/21/2013  Job:  [id number]

## 2013-06-22 ENCOUNTER — Inpatient Hospital Stay (HOSPITAL_COMMUNITY)
Admission: RE | Admit: 2013-06-22 | Discharge: 2013-06-25 | DRG: 765 | Disposition: A | Discharge status: Home / Self Care | Payer: BC Managed Care – PPO | Source: Ambulatory Visit | Attending: Obstetrics and Gynecology | Admitting: Obstetrics and Gynecology

## 2013-06-22 ENCOUNTER — Encounter (HOSPITAL_COMMUNITY)
Admission: RE | Disposition: A | Discharge status: Home / Self Care | Payer: Self-pay | Source: Ambulatory Visit | Attending: Obstetrics and Gynecology

## 2013-06-22 ENCOUNTER — Encounter (HOSPITAL_COMMUNITY): Payer: Self-pay | Admitting: *Deleted

## 2013-06-22 ENCOUNTER — Inpatient Hospital Stay (HOSPITAL_COMMUNITY): Payer: BC Managed Care – PPO | Admitting: Anesthesiology

## 2013-06-22 ENCOUNTER — Encounter (HOSPITAL_COMMUNITY): Payer: BC Managed Care – PPO | Admitting: Anesthesiology

## 2013-06-22 DIAGNOSIS — O9903 Anemia complicating the puerperium: Secondary | ICD-10-CM | POA: Diagnosis not present

## 2013-06-22 DIAGNOSIS — Z98891 History of uterine scar from previous surgery: Secondary | ICD-10-CM

## 2013-06-22 DIAGNOSIS — O09299 Supervision of pregnancy with other poor reproductive or obstetric history, unspecified trimester: Secondary | ICD-10-CM

## 2013-06-22 DIAGNOSIS — O30049 Twin pregnancy, dichorionic/diamniotic, unspecified trimester: Secondary | ICD-10-CM

## 2013-06-22 DIAGNOSIS — D62 Acute posthemorrhagic anemia: Secondary | ICD-10-CM | POA: Diagnosis not present

## 2013-06-22 DIAGNOSIS — O30043 Twin pregnancy, dichorionic/diamniotic, third trimester: Secondary | ICD-10-CM

## 2013-06-22 DIAGNOSIS — O34219 Maternal care for unspecified type scar from previous cesarean delivery: Principal | ICD-10-CM | POA: Diagnosis present

## 2013-06-22 DIAGNOSIS — O3090 Multiple gestation, unspecified, unspecified trimester: Secondary | ICD-10-CM | POA: Diagnosis present

## 2013-06-22 DIAGNOSIS — O329XX0 Maternal care for malpresentation of fetus, unspecified, not applicable or unspecified: Secondary | ICD-10-CM | POA: Diagnosis present

## 2013-06-22 HISTORY — DX: History of uterine scar from previous surgery: Z98.891

## 2013-06-22 LAB — TYPE AND SCREEN
ABO/RH(D): A POS
Antibody Screen: NEGATIVE

## 2013-06-22 LAB — RPR: RPR: NONREACTIVE

## 2013-06-22 LAB — ABO/RH: ABO/RH(D): A POS

## 2013-06-22 SURGERY — Surgical Case
Anesthesia: Spinal | Site: Abdomen

## 2013-06-22 MED ORDER — NALBUPHINE HCL 10 MG/ML IJ SOLN: 5.0000 mg | INTRAMUSCULAR | Status: DC | PRN

## 2013-06-22 MED ORDER — LACTATED RINGERS IV SOLN
Freq: Once | INTRAVENOUS | Status: AC
  Administered 2013-06-22: 08:00:00 via INTRAVENOUS

## 2013-06-22 MED ORDER — ONDANSETRON HCL 4 MG/2ML IJ SOLN: 4.0000 mg | INTRAMUSCULAR | Status: DC | PRN

## 2013-06-22 MED ORDER — OXYCODONE-ACETAMINOPHEN 5-325 MG PO TABS
1.0000 | ORAL_TABLET | ORAL | Status: DC | PRN
  Administered 2013-06-23 – 2013-06-24 (×2): 1 via ORAL
  Filled 2013-06-22 (×2): qty 1

## 2013-06-22 MED ORDER — TETANUS-DIPHTH-ACELL PERTUSSIS 5-2.5-18.5 LF-MCG/0.5 IM SUSP: 0.5000 mL | Freq: Once | INTRAMUSCULAR | Status: DC

## 2013-06-22 MED ORDER — METHYLERGONOVINE MALEATE 0.2 MG PO TABS: 0.2000 mg | ORAL_TABLET | ORAL | Status: DC | PRN

## 2013-06-22 MED ORDER — FENTANYL CITRATE 0.05 MG/ML IJ SOLN: 25.0000 ug | INTRAMUSCULAR | Status: DC | PRN

## 2013-06-22 MED ORDER — OXYTOCIN 40 UNITS IN LACTATED RINGERS INFUSION - SIMPLE MED: 62.5000 mL/h | INTRAVENOUS | Status: AC

## 2013-06-22 MED ORDER — ONDANSETRON HCL 4 MG/2ML IJ SOLN
INTRAMUSCULAR | Status: DC | PRN
  Administered 2013-06-22 (×2): 4 mg via INTRAVENOUS

## 2013-06-22 MED ORDER — CARBOPROST TROMETHAMINE 250 MCG/ML IM SOLN
INTRAMUSCULAR | Status: DC | PRN
  Administered 2013-06-22: 250 ug via INTRAMUSCULAR

## 2013-06-22 MED ORDER — DIPHENHYDRAMINE HCL 25 MG PO CAPS: 25.0000 mg | ORAL_CAPSULE | Freq: Four times a day (QID) | ORAL | Status: DC | PRN

## 2013-06-22 MED ORDER — OXYTOCIN 10 UNIT/ML IJ SOLN
INTRAMUSCULAR | Status: AC
  Filled 2013-06-22: qty 4

## 2013-06-22 MED ORDER — FENTANYL CITRATE 0.05 MG/ML IJ SOLN
INTRAMUSCULAR | Status: DC | PRN
  Administered 2013-06-22: 25 ug via INTRATHECAL

## 2013-06-22 MED ORDER — SODIUM CHLORIDE 0.9 % IJ SOLN
INTRAMUSCULAR | Status: AC
  Filled 2013-06-22: qty 20

## 2013-06-22 MED ORDER — KETOROLAC TROMETHAMINE 30 MG/ML IJ SOLN
30.0000 mg | Freq: Four times a day (QID) | INTRAMUSCULAR | Status: AC | PRN
  Administered 2013-06-22 (×2): 30 mg via INTRAVENOUS
  Filled 2013-06-22: qty 1

## 2013-06-22 MED ORDER — ONDANSETRON HCL 4 MG/2ML IJ SOLN: 4.0000 mg | Freq: Once | INTRAMUSCULAR | Status: DC | PRN

## 2013-06-22 MED ORDER — MISOPROSTOL 200 MCG PO TABS
ORAL_TABLET | ORAL | Status: AC
  Filled 2013-06-22: qty 5

## 2013-06-22 MED ORDER — ONDANSETRON HCL 4 MG PO TABS: 4.0000 mg | ORAL_TABLET | ORAL | Status: DC | PRN

## 2013-06-22 MED ORDER — MORPHINE SULFATE 0.5 MG/ML IJ SOLN
INTRAMUSCULAR | Status: AC
  Filled 2013-06-22: qty 10

## 2013-06-22 MED ORDER — SODIUM CHLORIDE 0.9 % IJ SOLN
INTRAMUSCULAR | Status: DC | PRN
  Administered 2013-06-22: 10 mL via INTRAVENOUS

## 2013-06-22 MED ORDER — METHYLERGONOVINE MALEATE 0.2 MG/ML IJ SOLN: 0.2000 mg | INTRAMUSCULAR | Status: DC | PRN

## 2013-06-22 MED ORDER — ALBUTEROL SULFATE (2.5 MG/3ML) 0.083% IN NEBU: 3.0000 mL | INHALATION_SOLUTION | Freq: Four times a day (QID) | RESPIRATORY_TRACT | Status: DC | PRN

## 2013-06-22 MED ORDER — SIMETHICONE 80 MG PO CHEW
80.0000 mg | CHEWABLE_TABLET | Freq: Three times a day (TID) | ORAL | Status: DC
  Administered 2013-06-23 – 2013-06-24 (×5): 80 mg via ORAL
  Filled 2013-06-22 (×7): qty 1

## 2013-06-22 MED ORDER — DIBUCAINE 1 % RE OINT: 1.0000 "application " | TOPICAL_OINTMENT | RECTAL | Status: DC | PRN

## 2013-06-22 MED ORDER — WITCH HAZEL-GLYCERIN EX PADS
1.0000 | MEDICATED_PAD | CUTANEOUS | Status: DC | PRN
Start: 2013-06-22 — End: 2013-06-25

## 2013-06-22 MED ORDER — SCOPOLAMINE 1 MG/3DAYS TD PT72: 1.0000 | MEDICATED_PATCH | Freq: Once | TRANSDERMAL | Status: DC

## 2013-06-22 MED ORDER — DIPHENHYDRAMINE HCL 50 MG/ML IJ SOLN: 12.5000 mg | INTRAMUSCULAR | Status: DC | PRN

## 2013-06-22 MED ORDER — FENTANYL CITRATE 0.05 MG/ML IJ SOLN
INTRAMUSCULAR | Status: AC
  Filled 2013-06-22: qty 2

## 2013-06-22 MED ORDER — ONDANSETRON HCL 4 MG/2ML IJ SOLN
INTRAMUSCULAR | Status: AC
  Filled 2013-06-22: qty 2

## 2013-06-22 MED ORDER — IBUPROFEN 600 MG PO TABS
600.0000 mg | ORAL_TABLET | Freq: Four times a day (QID) | ORAL | Status: DC
  Administered 2013-06-23 – 2013-06-25 (×11): 600 mg via ORAL
  Filled 2013-06-22 (×11): qty 1

## 2013-06-22 MED ORDER — LACTATED RINGERS IV SOLN
INTRAVENOUS | Status: DC | PRN
  Administered 2013-06-22: 11:00:00 via INTRAVENOUS

## 2013-06-22 MED ORDER — OXYTOCIN 10 UNIT/ML IJ SOLN
40.0000 [IU] | INTRAVENOUS | Status: DC | PRN
  Administered 2013-06-22: 40 [IU] via INTRAVENOUS

## 2013-06-22 MED ORDER — LACTATED RINGERS IV SOLN
INTRAVENOUS | Status: DC | PRN
  Administered 2013-06-22 (×2): via INTRAVENOUS

## 2013-06-22 MED ORDER — ACETAMINOPHEN 160 MG/5ML PO SOLN: 325.0000 mg | ORAL | Status: DC | PRN

## 2013-06-22 MED ORDER — SCOPOLAMINE 1 MG/3DAYS TD PT72
1.0000 | MEDICATED_PATCH | Freq: Once | TRANSDERMAL | Status: DC
  Administered 2013-06-22: 1.5 mg via TRANSDERMAL

## 2013-06-22 MED ORDER — PHENYLEPHRINE HCL 10 MG/ML IJ SOLN
INTRAMUSCULAR | Status: AC
  Filled 2013-06-22: qty 1

## 2013-06-22 MED ORDER — NALOXONE HCL 0.4 MG/ML IJ SOLN: 0.4000 mg | INTRAMUSCULAR | Status: DC | PRN

## 2013-06-22 MED ORDER — ONDANSETRON HCL 4 MG/2ML IJ SOLN: 4.0000 mg | Freq: Three times a day (TID) | INTRAMUSCULAR | Status: DC | PRN

## 2013-06-22 MED ORDER — DIPHENHYDRAMINE HCL 50 MG/ML IJ SOLN: 25.0000 mg | INTRAMUSCULAR | Status: DC | PRN

## 2013-06-22 MED ORDER — MORPHINE SULFATE (PF) 0.5 MG/ML IJ SOLN
INTRAMUSCULAR | Status: DC | PRN
  Administered 2013-06-22: .15 mg via INTRATHECAL

## 2013-06-22 MED ORDER — SENNOSIDES-DOCUSATE SODIUM 8.6-50 MG PO TABS
2.0000 | ORAL_TABLET | ORAL | Status: DC
  Administered 2013-06-23 – 2013-06-25 (×3): 2 via ORAL
  Filled 2013-06-22 (×3): qty 2

## 2013-06-22 MED ORDER — SCOPOLAMINE 1 MG/3DAYS TD PT72
MEDICATED_PATCH | TRANSDERMAL | Status: AC
  Administered 2013-06-22: 1.5 mg via TRANSDERMAL
  Filled 2013-06-22: qty 1

## 2013-06-22 MED ORDER — ZOLPIDEM TARTRATE 5 MG PO TABS: 5.0000 mg | ORAL_TABLET | Freq: Every evening | ORAL | Status: DC | PRN

## 2013-06-22 MED ORDER — MEPERIDINE HCL 25 MG/ML IJ SOLN: 6.2500 mg | INTRAMUSCULAR | Status: DC | PRN

## 2013-06-22 MED ORDER — FLUOXETINE HCL 20 MG PO CAPS
20.0000 mg | ORAL_CAPSULE | Freq: Every day | ORAL | Status: DC
  Administered 2013-06-23 – 2013-06-24 (×2): 20 mg via ORAL
  Filled 2013-06-22 (×5): qty 1

## 2013-06-22 MED ORDER — BUPIVACAINE HCL (PF) 0.25 % IJ SOLN
INTRAMUSCULAR | Status: AC
  Filled 2013-06-22: qty 30

## 2013-06-22 MED ORDER — LACTATED RINGERS IV SOLN
INTRAVENOUS | Status: DC
  Administered 2013-06-22: 10:00:00 via INTRAVENOUS

## 2013-06-22 MED ORDER — PHENYLEPHRINE 8 MG IN D5W 100 ML (0.08MG/ML) PREMIX OPTIME
INJECTION | INTRAVENOUS | Status: DC | PRN
  Administered 2013-06-22: 60 ug/min via INTRAVENOUS

## 2013-06-22 MED ORDER — SODIUM CHLORIDE 0.9 % IJ SOLN: 3.0000 mL | INTRAMUSCULAR | Status: DC | PRN

## 2013-06-22 MED ORDER — CEFAZOLIN SODIUM-DEXTROSE 2-3 GM-% IV SOLR: 2.0000 g | INTRAVENOUS | Status: DC

## 2013-06-22 MED ORDER — MENTHOL 3 MG MT LOZG: 1.0000 | LOZENGE | OROMUCOSAL | Status: DC | PRN

## 2013-06-22 MED ORDER — DIPHENHYDRAMINE HCL 25 MG PO CAPS: 25.0000 mg | ORAL_CAPSULE | ORAL | Status: DC | PRN

## 2013-06-22 MED ORDER — METOCLOPRAMIDE HCL 5 MG/ML IJ SOLN: 10.0000 mg | Freq: Three times a day (TID) | INTRAMUSCULAR | Status: DC | PRN

## 2013-06-22 MED ORDER — PHENYLEPHRINE 8 MG IN D5W 100 ML (0.08MG/ML) PREMIX OPTIME
INJECTION | INTRAVENOUS | Status: AC
Start: 2013-06-22 — End: 2013-06-22
  Filled 2013-06-22: qty 100

## 2013-06-22 MED ORDER — KETOROLAC TROMETHAMINE 30 MG/ML IJ SOLN: 30.0000 mg | Freq: Four times a day (QID) | INTRAMUSCULAR | Status: AC | PRN

## 2013-06-22 MED ORDER — SIMETHICONE 80 MG PO CHEW
80.0000 mg | CHEWABLE_TABLET | ORAL | Status: DC
  Administered 2013-06-23 – 2013-06-25 (×3): 80 mg via ORAL
  Filled 2013-06-22 (×3): qty 1

## 2013-06-22 MED ORDER — DEXTROSE 5 % IV SOLN
1.0000 ug/kg/h | INTRAVENOUS | Status: DC | PRN
  Filled 2013-06-22: qty 2

## 2013-06-22 MED ORDER — LANOLIN HYDROUS EX OINT: 1.0000 "application " | TOPICAL_OINTMENT | CUTANEOUS | Status: DC | PRN

## 2013-06-22 MED ORDER — ACETAMINOPHEN 325 MG PO TABS: 325.0000 mg | ORAL_TABLET | ORAL | Status: DC | PRN

## 2013-06-22 MED ORDER — LACTATED RINGERS IV SOLN
INTRAVENOUS | Status: DC
  Administered 2013-06-22: 18:00:00 via INTRAVENOUS

## 2013-06-22 MED ORDER — BUPIVACAINE HCL (PF) 0.25 % IJ SOLN
INTRAMUSCULAR | Status: DC | PRN
  Administered 2013-06-22: 10 mL

## 2013-06-22 MED ORDER — KETOROLAC TROMETHAMINE 30 MG/ML IJ SOLN
INTRAMUSCULAR | Status: AC
  Filled 2013-06-22: qty 1

## 2013-06-22 MED ORDER — PRENATAL MULTIVITAMIN CH
1.0000 | ORAL_TABLET | Freq: Every day | ORAL | Status: DC
  Administered 2013-06-23 – 2013-06-24 (×2): 1 via ORAL
  Filled 2013-06-22 (×2): qty 1

## 2013-06-22 MED ORDER — SIMETHICONE 80 MG PO CHEW
80.0000 mg | CHEWABLE_TABLET | ORAL | Status: DC | PRN
  Administered 2013-06-23: 80 mg via ORAL
  Filled 2013-06-22: qty 1

## 2013-06-22 MED ORDER — CEFAZOLIN SODIUM-DEXTROSE 2-3 GM-% IV SOLR
INTRAVENOUS | Status: AC
  Administered 2013-06-22: 2 g via INTRAVENOUS
  Filled 2013-06-22: qty 50

## 2013-06-22 SURGICAL SUPPLY — 33 items
CLAMP CORD UMBIL (MISCELLANEOUS) IMPLANT
CLOTH BEACON ORANGE TIMEOUT ST (SAFETY) ×2 IMPLANT
CONTAINER PREFILL 10% NBF 15ML (MISCELLANEOUS) IMPLANT
DERMABOND ADVANCED (GAUZE/BANDAGES/DRESSINGS) ×1
DERMABOND ADVANCED .7 DNX12 (GAUZE/BANDAGES/DRESSINGS) ×1 IMPLANT
DRAPE LG THREE QUARTER DISP (DRAPES) IMPLANT
DRSG OPSITE POSTOP 4X10 (GAUZE/BANDAGES/DRESSINGS) ×2 IMPLANT
DURAPREP 26ML APPLICATOR (WOUND CARE) ×2 IMPLANT
ELECT REM PT RETURN 9FT ADLT (ELECTROSURGICAL) ×2
ELECTRODE REM PT RTRN 9FT ADLT (ELECTROSURGICAL) ×1 IMPLANT
EXTRACTOR VACUUM M CUP 4 TUBE (SUCTIONS) IMPLANT
GLOVE BIO SURGEON STRL SZ7.5 (GLOVE) ×2 IMPLANT
GOWN PREVENTION PLUS XLARGE (GOWN DISPOSABLE) ×2 IMPLANT
GOWN STRL REIN XL XLG (GOWN DISPOSABLE) ×2 IMPLANT
KIT ABG SYR 3ML LUER SLIP (SYRINGE) IMPLANT
NEEDLE HYPO 25X1 1.5 SAFETY (NEEDLE) ×2 IMPLANT
NEEDLE HYPO 25X5/8 SAFETYGLIDE (NEEDLE) IMPLANT
NS IRRIG 1000ML POUR BTL (IV SOLUTION) ×2 IMPLANT
PACK C SECTION WH (CUSTOM PROCEDURE TRAY) ×2 IMPLANT
STAPLER VISISTAT 35W (STAPLE) IMPLANT
SUT MNCRL 0 VIOLET CTX 36 (SUTURE) ×3 IMPLANT
SUT MNCRL AB 3-0 PS2 27 (SUTURE) ×4 IMPLANT
SUT MON AB 2-0 CT1 27 (SUTURE) ×2 IMPLANT
SUT MON AB-0 CT1 36 (SUTURE) ×6 IMPLANT
SUT MONOCRYL 0 CTX 36 (SUTURE) ×3
SUT PLAIN 0 NONE (SUTURE) IMPLANT
SUT PLAIN 2 0 (SUTURE)
SUT PLAIN 2 0 XLH (SUTURE) IMPLANT
SUT PLAIN ABS 2-0 CT1 27XMFL (SUTURE) IMPLANT
SYR CONTROL 10ML LL (SYRINGE) ×2 IMPLANT
TOWEL OR 17X24 6PK STRL BLUE (TOWEL DISPOSABLE) ×2 IMPLANT
TRAY FOLEY CATH 14FR (SET/KITS/TRAYS/PACK) ×2 IMPLANT
WATER STERILE IRR 1000ML POUR (IV SOLUTION) IMPLANT

## 2013-06-22 NOTE — Lactation Note (Signed)
This note was copied from the chart of [name] Stephens. Lactation Consultation Note  Patient Name: [name] Stephens Today's Date: 06/22/2013 Reason for consult: Initial assessment   Maternal Data Formula Feeding for Exclusion: No Infant to breast within first hour of birth: No Breastfeeding delayed due to:: Infant status (went to [facility] nursery for low O2 stats, request per Neo.) Has patient been taught Hand Expression?: No (hand expression discussed but patient will need further teaching) Does the patient have breastfeeding experience prior to this delivery?: No  Called to PACU to assist with mother with breastfeeding her twins. Baby B was taken to NBN for low 02 sats. Once resolved, by was taken to PACU to be with mother, father and twin sibling. Baby showing feeding cues and assisted to the breast. Several attempts were made before he was able to maintain his latch. Suck training on gloved LC finger helped to coordinates baby's effort. With assist, baby went to the breast and maintained latch for 12 minutes. Mother felt strong tugs. Colostrum easily expressed. He fed well and then rested on mother's chest skin to skin before removal for weight assessment. LC and RN to observe baby for feeding effort due to twin and 37 weeks. Lactation brochure taken to room and briefly discussed with parents.   Feeding Feeding Type: Breast Fed Length of feed: 12 min  LATCH Score/Interventions Latch: Repeated attempts needed to sustain latch, nipple held in mouth throughout feeding, stimulation needed to elicit sucking reflex. Intervention(s): Adjust position;Assist with latch;Breast compression  Audible Swallowing: Spontaneous and intermittent  Type of Nipple: Flat  Comfort (Breast/Nipple): Soft / non-tender     Hold (Positioning): Assistance needed to correctly position infant at breast and maintain latch.  LATCH Score: 7  Lactation Tools Discussed/Used     Consult Status Consult Status:  Follow-up Date: 06/22/13 Follow-up type: In-patient    Tallin Hart M 06/22/2013, 1:38 PM    

## 2013-06-22 NOTE — Transfer of Care (Signed)
Immediate Anesthesia Transfer of Care Note  Patient: [name] Stephens  Procedure(s) Performed: Procedure(s) with comments: Repeat CESAREAN SECTION; Twins (N/A) - EDD: 07/12/13  Patient Location: PACU  Anesthesia Type:Spinal  Level of Consciousness: awake, alert , oriented and patient cooperative  Airway & Oxygen Therapy: Patient Spontanous Breathing  Post-op Assessment: Report given to PACU RN and Post -op Vital signs reviewed and stable  Post vital signs: Reviewed and stable  Complications: No apparent anesthesia complications

## 2013-06-22 NOTE — Anesthesia Procedure Notes (Signed)
Spinal  Additional Notes Spinal Dosage in OR  Bupivicaine ml       1.7 PFMS04   mcg        150 Fentanyl mcg            25

## 2013-06-22 NOTE — Op Note (Signed)
Cesarean Section Procedure Note  Indications: previous uterine incision kerr x one Malpresentation twin B  Pre-operative Diagnosis: 37 week 1 day pregnancy.  Post-operative Diagnosis: same  Surgeon: Lenoard AdenAAVON,Viva Gallaher J   Assistants: Denyse AmassBhambri, CNM  Anesthesia: Local anesthesia 0.25.% bupivacaine and Spinal anesthesia  ASA Class: 2  Procedure Details  The patient was seen in the Holding Room. The risks, benefits, complications, treatment options, and expected outcomes were discussed with the patient.  The patient concurred with the proposed plan, giving informed consent. The risks of anesthesia, infection, bleeding and possible injury to other organs discussed. Injury to bowel, bladder, or ureter with possible need for repair discussed. Possible need for transfusion with secondary risks of hepatitis or HIV acquisition discussed. Post operative complications to include but not limited to DVT, PE and Pneumonia noted. The site of surgery properly noted/marked. The patient was taken to Operating Room # 1, identified as [name] and the procedure verified as C-Section Delivery. A Time Out was held and the above information confirmed.  After induction of anesthesia, the patient was draped and prepped in the usual sterile manner. A Pfannenstiel incision was made and carried down through the subcutaneous tissue to the fascia. Fascial incision was made and extended transversely using Mayo scissors. The fascia was separated from the underlying rectus tissue superiorly and inferiorly. The peritoneum was identified and entered. Peritoneal incision was extended longitudinally. The utero-vesical peritoneal reflection was incised transversely and the bladder flap was bluntly freed from the lower uterine segment. A low transverse uterine incision(Kerr hysterotomy) was made. Delivered from Vertex presentation was a  Female twin A with Apgar scores of 8 at one minute and 9 at five minutes. Bulb suctioning gently  performed. Neonatal team in attendance.After the umbilical cord was clamped and cut cord blood was obtained for evaluation. The placenta was removed intact and appeared normal. Delivered from transverse presentation was a  Female twin B with Apgar scores of 8 at one minute and 9 at five minutes. Bulb suctioning gently performed. Neonatal team in attendance.After the umbilical cord was clamped and cut cord blood was obtained for evaluation. The placenta was removed intact and appeared normal.The uterus was curetted with a dry lap pack. Good hemostasis was noted.The uterine outline, tubes and ovaries appeared normal. The uterine incision was closed with running locked sutures of 0 Monocryl x 2 layers. Hemabate given into myometrium. Hemostasis was observed. Lavage was carried out until clear.The parietal peritoneum was closed with a running 2-0 Monocryl suture. The fascia was then reapproximated with running sutures of 0 Monocryl. The skin was reapproximated with 3-0 monocryl after [place] closure with 2-0 plain.  Instrument, sponge, and needle counts were correct prior the abdominal closure and at the conclusion of the case.   Findings: As noted  Estimated Blood Loss:  800         Drains: foley                 Specimens: placentas x 2                 Complications:  None; patient tolerated the procedure well.         Disposition: PACU - hemodynamically stable.         Condition: stable  Attending Attestation: I performed the procedure.

## 2013-06-22 NOTE — Anesthesia Preprocedure Evaluation (Signed)
Anesthesia Evaluation  Patient identified by MRN, date of birth, ID band Patient awake    Reviewed: Allergy & Precautions, H&P , NPO status , Patient's Chart, lab work & pertinent test results  History of Anesthesia Complications Negative for: history of anesthetic complications  Airway Mallampati: III TM Distance: >3 FB Neck ROM: Full    Dental  (+) Teeth Intact   Pulmonary neg shortness of breath, asthma , neg sleep apnea, neg COPDneg recent URI,    Pulmonary exam normal       Cardiovascular negative cardio ROS  Rhythm:Regular Rate:Normal  Severe preeclampsia with previous 27 week trisomy 13 fetus, no HTN with current pregnancy   Neuro/Psych Anxiety Depression negative neurological ROS     GI/Hepatic negative GI ROS, Neg liver ROS,   Endo/Other  negative endocrine ROS  Renal/GU negative Renal ROS  negative genitourinary   Musculoskeletal   Abdominal   Peds  Hematology  (+) anemia ,   Anesthesia Other Findings   Reproductive/Obstetrics (+) Pregnancy                           Anesthesia Physical Anesthesia Plan  ASA: III  Anesthesia Plan: Spinal   Post-op Pain Management:    Induction:   Airway Management Planned: Natural Airway  Additional Equipment: None  Intra-op Plan:   Post-operative Plan:   Informed Consent: I have reviewed the patients History and Physical, chart, labs and discussed the procedure including the risks, benefits and alternatives for the proposed anesthesia with the patient or authorized representative who has indicated his/her understanding and acceptance.   Dental advisory given  Plan Discussed with: CRNA and Surgeon  Anesthesia Plan Comments:         Anesthesia Quick Evaluation

## 2013-06-22 NOTE — Lactation Note (Signed)
This note was copied from the chart of BoyA Catalina AntiguaJessica Wendel. Lactation Consultation Note  Patient Name: Kathryne ErikssonBoyA Jamilah Kingsberry EAVWU'JToday's Date: 06/22/2013 Reason for consult: Initial assessment   Maternal Data Formula Feeding for Exclusion: No Infant to breast within first hour of birth: Yes Has patient been taught Hand Expression?: No (Discussed but was not able to teach, needs teaching) Does the patient have breastfeeding experience prior to this delivery?: No   Called to PACU to assist with twin infants that are breastfeeding. Both babies are showing feeding cues and making attempts to latch. Twin A assisted to breast. He suckles briefly before slipping off the breast. He is tongue thrusting and unable to maintain latch initially. Baby sucked on LC gloved finger which helped to coordinate his suck. With assistance, baby A fed for 7 minutes on and off the breast. Alternate breast massage and compression helped him to maintain latch. Mother has colostrum that is easily expressed. After feeding, he laid calmly on mother's chest skin to skin before being removed for weight check. 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 Discussed 37 week feeding behavior. RN and LC will observe for feeding efforts and may start mother pumping as needed.  Feeding Feeding Type: Breast Fed (Simultaneous filing. User may not have seen previous data.) Length of feed: 7 min (on and off)  LATCH Score/Interventions Latch: Repeated attempts needed to sustain latch, nipple held in mouth throughout feeding, stimulation needed to elicit sucking reflex. Intervention(s): Assist with latch;Adjust position;Breast compression  Audible Swallowing: A few with stimulation  Type of Nipple: Flat (mother in supine position in PACU)  Comfort (Breast/Nipple): Soft / non-tender     Hold (Positioning): Assistance needed to correctly position infant at breast and maintain latch. (In PACU) Intervention(s): Skin to skin;Position  options;Breastfeeding basics reviewed  LATCH Score: 6  Lactation Tools Discussed/Used     Consult Status Consult Status: Follow-up Date: 06/23/13 Follow-up type: In-patient    Christella HartiganDaly, Yomaris Palecek M 06/22/2013, 1:26 PM

## 2013-06-22 NOTE — Progress Notes (Signed)
Patient ID: [name] Stephens, female   DOB: [birth year], 28 y.o.   MRN: [id number] Patient seen and examined. Consent witnessed and signed. No changes noted. Update completed. CBC    Component Value Date/Time   WBC 11.7* 06/21/2013 0340   RBC 3.87 06/21/2013 0340   HGB 12.3 06/21/2013 0340   HCT 34.5* 06/21/2013 0340   PLT 180 06/21/2013 0340   MCV 89.1 06/21/2013 0340   MCH 31.8 06/21/2013 0340   MCHC 35.7 06/21/2013 0340   RDW 13.1 06/21/2013 0340

## 2013-06-22 NOTE — Progress Notes (Signed)
Patient was referred for history of depression/anxiety. * Referral screened out by Clinical Social Worker because none of the following criteria appear to apply:  ~ History of anxiety/depression during this pregnancy, or of post-partum depression.  ~ Diagnosis of anxiety and/or depression within last 3 years  ~ History of depression due to pregnancy loss/loss of child  OR * Patient's symptoms currently being treated with medication (Prozac) and/or therapy.  Please contact the Clinical Social Worker if needs arise, or by the patient's request. 

## 2013-06-22 NOTE — Lactation Note (Signed)
This note was copied from the chart of [name] Catalina AntiguaJessica Marasigan. Lactation Consultation Note Attempted feeding assist but baby showing no interest in feeding.  Reassured parents and reviewed normal first 24 hours.  Encouraged to call for concerns/asssist prn.  Patient Name: [name] ErikssonBoyA Iolanda Stephens WUJWJ'XToday's Date: 06/22/2013 Reason for consult: Follow-up assessment;Late preterm infant;Multiple gestation   Maternal Data    Feeding Feeding Type: Breast Fed  LATCH Score/Interventions Latch: Too sleepy or reluctant, no latch achieved, no sucking elicited. Intervention(s): Skin to skin;Teach feeding cues;Waking techniques  Audible Swallowing: None  Type of Nipple: Everted at rest and after stimulation  Comfort (Breast/Nipple): Soft / non-tender     Hold (Positioning): Assistance needed to correctly position infant at breast and maintain latch. Intervention(s): Breastfeeding basics reviewed;Support Pillows;Position options;Skin to skin  LATCH Score: 5  Lactation Tools Discussed/Used     Consult Status Consult Status: Follow-up Date: 06/23/13 Follow-up type: In-patient    [name] Stephens, [name] Stephens 06/22/2013, 4:53 PM

## 2013-06-22 NOTE — Anesthesia Preprocedure Evaluation (Deleted)
Anesthesia Evaluation  Patient identified by MRN, date of birth, ID band Patient awake    Reviewed: Allergy & Precautions, H&P , NPO status , Patient's Chart, lab work & pertinent test results  History of Anesthesia Complications Negative for: history of anesthetic complications  Airway Mallampati: II TM Distance: >3 FB Neck ROM: Full    Dental  (+) Teeth Intact   Pulmonary neg shortness of breath, asthma , neg sleep apnea, neg COPDneg recent URI,  Exercise induced, no hospitalizations   Pulmonary exam normal       Cardiovascular negative cardio ROS  Rhythm:Regular Rate:Normal     Neuro/Psych Anxiety Depression negative neurological ROS     GI/Hepatic Neg liver ROS, GERD-  Medicated and Controlled,  Endo/Other  diabetes, Type 1, Insulin Dependent  Renal/GU negative Renal ROS  negative genitourinary   Musculoskeletal negative musculoskeletal ROS (+)   Abdominal   Peds  Hematology  (+) anemia ,   Anesthesia Other Findings   Reproductive/Obstetrics (+) Pregnancy                           Anesthesia Physical Anesthesia Plan  ASA: III  Anesthesia Plan: Spinal   Post-op Pain Management:    Induction:   Airway Management Planned: Natural Airway  Additional Equipment: None  Intra-op Plan:   Post-operative Plan:   Informed Consent: I have reviewed the patients History and Physical, chart, labs and discussed the procedure including the risks, benefits and alternatives for the proposed anesthesia with the patient or authorized representative who has indicated his/her understanding and acceptance.   Dental advisory given  Plan Discussed with: CRNA and Surgeon  Anesthesia Plan Comments:         Anesthesia Quick Evaluation

## 2013-06-22 NOTE — Anesthesia Postprocedure Evaluation (Signed)
  Anesthesia Post-op Note  Patient: [name] Stephens  Procedure(s) Performed: Procedure(s) with comments: Repeat CESAREAN SECTION; Twins (N/A) - EDD: 07/12/13  Patient Location: PACU  Anesthesia Type:Spinal  Level of Consciousness: awake, alert  and oriented  Airway and Oxygen Therapy: Patient Spontanous Breathing  Post-op Pain: mild  Post-op Assessment: Post-op Vital signs reviewed, Patient's Cardiovascular Status Stable, Respiratory Function Stable, Patent Airway, No signs of Nausea or vomiting and Pain level controlled  Post-op Vital Signs: Reviewed and stable  Complications: No apparent anesthesia complications

## 2013-06-23 ENCOUNTER — Encounter (HOSPITAL_COMMUNITY): Payer: Self-pay | Admitting: *Deleted

## 2013-06-23 LAB — CBC
HEMATOCRIT: 27.2 % — AB (ref 36.0–46.0)
Hemoglobin: 9.3 g/dL — ABNORMAL LOW (ref 12.0–15.0)
MCH: 30.9 pg (ref 26.0–34.0)
MCHC: 34.2 g/dL (ref 30.0–36.0)
MCV: 90.4 fL (ref 78.0–100.0)
PLATELETS: 152 10*3/uL (ref 150–400)
RBC: 3.01 MIL/uL — ABNORMAL LOW (ref 3.87–5.11)
RDW: 13.4 % (ref 11.5–15.5)
WBC: 10.1 10*3/uL (ref 4.0–10.5)

## 2013-06-23 MED ORDER — PNEUMOCOCCAL VAC POLYVALENT 25 MCG/0.5ML IJ INJ
0.5000 mL | INJECTION | INTRAMUSCULAR | Status: AC
  Administered 2013-06-24: 0.5 mL via INTRAMUSCULAR
  Filled 2013-06-23: qty 0.5

## 2013-06-23 NOTE — Addendum Note (Signed)
Addendum created 06/23/13 0757 by Jhonnie GarnerBeth M Haydin Calandra, CRNA   Modules edited: Notes Section   Notes Section:  File: [id number]

## 2013-06-23 NOTE — Lactation Note (Signed)
This note was copied from the chart of BoyB Catalina AntiguaJessica Castorena. Lactation Consultation Note  Patient Name: Genella MechBoyB Marija Colvin JXBJY'NToday's Date: 06/23/2013 Reason for consult: Follow-up assessment;Multiple gestation Babies have been sleepy this afternoon after circumcision. Mom reports baby falling asleep at the breast and she has some nipple tenderness. Bruising on left nipple, right nipple is sore. At this visit, latched Baby A 1st as he was giving feeding ques. After several attempts and breast compression, he was able to latch and demonstrated a good suckling pattern with 1-2 swallows noted. Mom has lots of colostrum with hand expression. Baby A latched to right breast in football hold. Baby B was sleepy, but with stimulation did become interested in the breast. Initially latched in football hold, then changed to cross cradle. Baby would pop on and off the breast. Tried #20 nipple shield to help baby sustain a latch, he had difficulty organizing his suck with the nipple shield so went back to football hold without the nipple shield, using breast compression  he was able to latch. Once he obtained good depth he demonstrated a good suckling pattern. Her nursed off and on for about 10 minutes. Hand expressed a gave Baby B 5 ml of colostrum via spoon and finger feeding. RN set up DEBP, reviewed with Mom how to pump on preemie setting to encourage milk production. Plan at this time is Mom will BF with feeding ques, but at least every 3 hours. If babies are not actively nursing for 15 minutes then advised to supplement with EBM or formula after feedings. Guidelines for supplementing with BF reviewed with family. FOB and Grandmother here to help Mom. Encouraged Mom to post pump at least 2 times tonight or every 3 hours if babies do not latch well. Demonstrated to family how to use curved tipped syringe to supplement, also advised could use bottle with slow flow nipple if feedings are becoming overwhelming. Parents report can  work with this plan tonight. LC follow up tomorrow. Advised to ask for assist as needed with feedings, RN advised of plan.   Maternal Data    Feeding Feeding Type: Breast Milk Length of feed: 10 min (off and on)  LATCH Score/Interventions Latch: Repeated attempts needed to sustain latch, nipple held in mouth throughout feeding, stimulation needed to elicit sucking reflex. Intervention(s): Adjust position;Assist with latch;Breast massage;Breast compression  Audible Swallowing: A few with stimulation  Type of Nipple: Everted at rest and after stimulation (short nipple shaft/aerola edema) Intervention(s): Double electric pump  Comfort (Breast/Nipple): Filling, red/small blisters or bruises, mild/mod discomfort  Problem noted: Mild/Moderate discomfort Interventions (Mild/moderate discomfort): Comfort gels;Pre-pump if needed;Post-pump;Hand expression  Hold (Positioning): Assistance needed to correctly position infant at breast and maintain latch.  LATCH Score: 6  Lactation Tools Discussed/Used Tools: Pump;Comfort gels;Nipple Shields Nipple shield size: 20 Breast pump type: Double-Electric Breast Pump   Consult Status Consult Status: Follow-up Date: 06/24/13 Follow-up type: In-patient    Alfred LevinsGranger, Jac Romulus Ann 06/23/2013, 7:38 PM

## 2013-06-23 NOTE — Anesthesia Postprocedure Evaluation (Signed)
Anesthesia Post Note  Patient: Catalina AntiguaJessica Voller  Procedure(s) Performed: Procedure(s) (LRB): Repeat CESAREAN SECTION; Twins (N/A)  Anesthesia type: SAB  Patient location: Mother/Baby  Post pain: Pain level controlled  Post assessment: Post-op Vital signs reviewed  Last Vitals:  Filed Vitals:   06/23/13 0500  BP: 109/68  Pulse: 90  Temp: 37.1 C  Resp: 18    Post vital signs: Reviewed  Level of consciousness: awake  Complications: No apparent anesthesia complications

## 2013-06-23 NOTE — Progress Notes (Signed)
Patient ID: [name] Stephens, female   DOB: 04-14-1986, 28 y.o.   MRN: [id number] POD # 1  Subjective: Pt reports feeling well, but tired, sore/ Pain controlled with ibuprofen and percocet Tolerating po/ Foley d/c'ed and has voided >56400ml x 1/ No n/v/Flatus pos for small amt Activity: out of bed and ambulate Bleeding is light Newborn info:  Information for the patient's newborn:  [name] Stephens, BoyA Stephens [[id number]][[id number]]  female Information for the patient's newborn:  [name] Stephens, BoyB Stephens [[id number]][[id number]]  female  / circ to be performed by Dr [name] / Feeding: breast   Objective: VS: Blood pressure 109/68, pulse 90, temperature 98.7 F (37.1 C), temperature source Oral, resp. rate 18   Intake/Output Summary (Last 24 hours) at 06/23/13 0902 Last data filed at 06/23/13 0600  Gross per 24 hour  Intake 5617.92 ml  Output   6125 ml  Net -507.08 ml      Recent Labs  06/21/13 0340 06/23/13 0615  WBC 11.7* 10.1  HGB 12.3 9.3*  HCT 34.5* 27.2*  PLT 180 152    Blood type: A POS Rubella: Immune    Physical Exam:  General: alert, cooperative and no distress CV: Regular rate and rhythm Resp: clear Abdomen: soft, nontender, normal bowel sounds Incision: Covered with Tegaderm and honeycomb dressing; well approximated. Uterine Fundus: firm, below umbilicus, nontender Lochia: minimal Ext: Homans sign is negative, no sign of DVT and no edema, redness or tenderness in the calves or thighs    A/P: POD # 1/ [id number] S/P C/Section d/t malpresentation of Twin B Doing well Continue routine post op orders   Signed: Demetrius RevelFISHER,Idy Rawling K, MSN, [name] Stephens 06/23/2013, 9:02 AM

## 2013-06-24 MED ORDER — POLYSACCHARIDE IRON COMPLEX 150 MG PO CAPS
150.0000 mg | ORAL_CAPSULE | Freq: Two times a day (BID) | ORAL | Status: DC
  Administered 2013-06-24: 150 mg via ORAL
  Filled 2013-06-24 (×5): qty 1

## 2013-06-24 MED ORDER — ACETAMINOPHEN 325 MG PO TABS
650.0000 mg | ORAL_TABLET | Freq: Four times a day (QID) | ORAL | Status: DC | PRN
  Administered 2013-06-24 – 2013-06-25 (×3): 650 mg via ORAL
  Filled 2013-06-24 (×3): qty 2

## 2013-06-24 NOTE — Lactation Note (Signed)
This note was copied from the chart of [name] Stephens. Lactation Consultation Note  Patient Name: [name] Taelar Ginsberg Today's Date: 06/24/2013 Reason for consult: Follow-up assessment;Difficult latch;Breast/nipple pain;Multiple gestation Mom reports babies have been sleepy and not sustaining the latch well at the breast. Baby B has continued to come on and off the breast. Mom reports her left nipple is more tender than yesterday. Has been few hours since babies were at the breast. Reviewed with Mom that babies at 37 weeks will be sleepy and parents need to wake baby to BF. Demonstrated waking techniques to parents. Attempted to latch Baby A to the right breast, baby latched after few attempts, would take few suckles then fall asleep. Dr. Lowe came to examine babies and Mom reported to her that she was getting a migraine. Mom reported to LC that she could not continue at this time with BF but would put babies back to the breast when she felt better.  Discussed with Mom it could be beneficial to supplement as well till her milk comes in to help babies with weight loss/calories to support energy needs. Mom agrees with supplementing and it was decided they would bottle with slow flow nipple. Advised to supplement with 20 ml of EBM or formula using bottle with slow flow nipple. Assisted FOB with 1st bottle feeding. Plan developed with parents: When Mom feels better, to BF with feeding ques but at least every 3 hours, keep babies actively nursing for 15-20 minutes. FOB and MGM to assist with supplements after each feeding, 20 ml today, increasing per guidelines given. Mom to post pump every 3 hours for 15 minutes on Preemie setting. Ask for assist with feedings. If Mom not feeling well enough to breastfeed, she needs to pump every 3 hours for 15 minutes to encourage milk production and protect milk supply. Continue to supplement babies. If babies will not sustain a latch, re-try nipple shield to see if this will  help. Maternal Data    Feeding Feeding Type: Bottle Fed - Formula Length of feed: 5 min (off and on)  LATCH Score/Interventions Latch: Repeated attempts needed to sustain latch, nipple held in mouth throughout feeding, stimulation needed to elicit sucking reflex. Intervention(s): Adjust position;Assist with latch;Breast massage;Breast compression  Audible Swallowing: None  Type of Nipple: Everted at rest and after stimulation (short nipple shaft)  Comfort (Breast/Nipple): Filling, red/small blisters or bruises, mild/mod discomfort  Problem noted: Cracked, bleeding, blisters, bruises;Mild/Moderate discomfort Interventions  (Cracked/bleeding/bruising/blister): Double electric pump;Hand pump;Expressed breast milk to nipple Interventions (Mild/moderate discomfort): Comfort gels;Post-pump;Pre-pump if needed  Hold (Positioning): Assistance needed to correctly position infant at breast and maintain latch. Intervention(s): Breastfeeding basics reviewed;Support Pillows;Position options;Skin to skin  LATCH Score: 5  Lactation Tools Discussed/Used Tools: Nipple Shields;Pump;Comfort gels;Other (comment) (curved tipped syringe) Nipple shield size: 20 Breast pump type: Double-Electric Breast Pump   Consult Status Consult Status: Follow-up Date: 06/24/13 Follow-up type: In-patient    Glen Blatchley Ann 06/24/2013, 3:26 PM    

## 2013-06-24 NOTE — Lactation Note (Signed)
This note was copied from the chart of [name] Stephens. Lactation Consultation Note Follow up consult:  Mother placed [name] Stephens to breast in football hold.  Assisted mother with hand expression, good drops of colostrum.  [name] breastfed for 10 min with #20 nipple shield.  Demonstrated to parents how to prefill nipple shield and how to pace feed.  FOB supplemented [name] A with 20 ml of formula.  Mother then placed [name] Stephens in football hold on left breast without nipple shield.  [name] B breastfed for 5 min without #20 NS.  Applied NS and prefilled with but [name] was sleepy.  Reviewed waking techniques.  FOB supplemented with 20 ml of formula.  Plan is for the evening is to undress babies down to diaper prior to feeding and use #20 NS with each feeding to stimulate palate.  Mother should massage and hand express prior to breastfeeding.  Breastfeed babies for more than 10 min up to 20 min, then supplement, waking in between if they get sleepy.  Mother should post pump for 15 min after each feeding during the daytime.  At night mother should breastfeed only with NS and babies should be supplemented by support family after breastfeeding.  Try to contain feeding within a 30 min period for each feeding.  Encouraged parents to call if assistance is needed.   Patient Name: [name] ErikssonBoyA [name] Stephens AOZHY'QToday's Date: 06/24/2013 Reason for consult: Follow-up assessment   Maternal Data    Feeding Feeding Type: Breast Fed Length of feed: 5 min  LATCH Score/Interventions Latch: Repeated attempts needed to sustain latch, nipple held in mouth throughout feeding, stimulation needed to elicit sucking reflex. Intervention(s): Waking techniques Intervention(s): Adjust position;Assist with latch;Breast massage;Breast compression  Audible Swallowing: A few with stimulation Intervention(s): Hand expression  Type of Nipple: Everted at rest and after stimulation  Comfort (Breast/Nipple): Filling, red/small blisters or  bruises, mild/mod discomfort  Problem noted: Cracked, bleeding, blisters, bruises Interventions  (Cracked/bleeding/bruising/blister): Double electric pump Interventions (Mild/moderate discomfort): Comfort gels;Breast shields  Intervention(s): Breastfeeding basics reviewed;Support Pillows;Position options;Skin to skin     Lactation Tools Discussed/Used Tools: Nipple Shields Nipple shield size: 20 Breast pump type: Double-Electric Breast Pump   Consult Status Date: 06/25/13 Follow-up type: In-patient    [name] Stephens, [name] Stephens 06/24/2013, 4:04 PM

## 2013-06-24 NOTE — Progress Notes (Signed)
POD # 2  Subjective: Pt reports feeling well, some burning on left at incision site/ Pain controlled with Motrin and Percocet Tolerating po/Voiding without problems/ No n/v/ Flatus present Activity: ad lib Bleeding is light Newborn info:  Information for the patient's newborn:  [name], BoyA Keyah [[id number]][[id number]]  female Information for the patient's newborn:  Genella MechLee, BoyB Leotta [[id number]][[id number]]  female  / Circumcision: done/ Feeding: breast   Objective: VS:  Filed Vitals:   06/23/13 0930 06/23/13 1330 06/23/13 1850 06/24/13 0607  BP: 109/67 106/58 124/70 120/84  Pulse: 98 93 79 93  Temp: 98.2 F (36.8 C) 97.9 F (36.6 C) 98.1 F (36.7 C) 98 F (36.7 C)  TempSrc: Oral Oral Oral Oral  Resp: 18 18 16 18   Height:      Weight:      SpO2: 95% 98%      I&O: Intake/Output     02/25 0701 - 02/26 0700 02/26 0701 - 02/27 0700   P.O.     I.V. (mL/kg)     Total Intake(mL/kg)     Urine (mL/kg/hr) 750 (0.3)    Blood     Total Output 750     Net -750            LABS:  Recent Labs  06/23/13 0615  WBC 10.1  HGB 9.3*  HCT 27.2*  PLT 152    Blood type: --/--/A POS, A POS (02/24 0805) Rubella: Immune (11/20 1322)     Physical Exam:  General: alert and cooperative CV: Regular rate and rhythm Resp: CTA bilaterally Abdomen: soft, nontender, normal bowel sounds Uterine Fundus: firm, below umbilicus, nontender Incision: Covered with Tegaderm and honeycomb dressing; well approximated. Lochia: minimal Ext: edema 1+ to BLE and Homans sign is negative, no sign of DVT    Assessment/: POD # 2/ G2P1102/ S/P C/Section d/t twins, repeat ABL anemia Doing well  Plan: Continue routine post op orders Anticipate discharge home tomorrow   Signed: Lawernce PittsBHAMBRI, Arietta Eisenstein, N, MSN, CNM 06/24/2013, 10:55 AM

## 2013-06-25 MED ORDER — POLYSACCHARIDE IRON COMPLEX 150 MG PO CAPS: 150.0000 mg | ORAL_CAPSULE | Freq: Two times a day (BID) | ORAL | Status: AC

## 2013-06-25 MED ORDER — SENNOSIDES-DOCUSATE SODIUM 8.6-50 MG PO TABS: 2.0000 | ORAL_TABLET | ORAL | Status: AC

## 2013-06-25 MED ORDER — IBUPROFEN 600 MG PO TABS: 600.0000 mg | ORAL_TABLET | Freq: Four times a day (QID) | ORAL | Status: AC | PRN

## 2013-06-25 MED ORDER — OXYCODONE-ACETAMINOPHEN 5-325 MG PO TABS: 1.0000 | ORAL_TABLET | ORAL | Status: AC | PRN

## 2013-06-25 NOTE — Discharge Summary (Signed)
Obstetric Discharge Summary Reason for Admission: G2 P0 1 0 0, Di Di Twin gestation with concordant growth @ 37wks for repeat C/S with malpresentation of Twin B.     During pregnancy, Intermittent HTN, without PEC.         Previous preg hx significant for 27wk C/S with severe PEC and Trisomy) Prenatal Procedures: NST and ultrasound Intrapartum Procedures: cesarean: low cervical, transverse Postpartum Procedures: none Complications-Operative and Postpartum: none Hemoglobin  Date Value Ref Range Status  06/23/2013 9.3* 12.0 - 15.0 g/dL Final     HCT  Date Value Ref Range Status  06/23/2013 27.2* 36.0 - 46.0 % Final    Physical Exam:  General: alert, cooperative and no distress Lochia: appropriate Uterine Fundus: firm Incision: Covered with Tegaderm and honeycomb dressing; well approximated. Closed with sutures. DVT Evaluation: No evidence of DVT seen on physical exam. Negative Homan's sign.  Discharge Diagnoses: G2 P1 1 0 2 s/p rpt C/S d/t malpresentation of Twin B     Hx PEC in previous pregnancy with intermittent HTN this gestation; stable BP's pp at present.  Discharge Information: Date: 06/25/2013 Activity: pelvic rest Diet: routine Medications: PNV, Ibuprofen, Colace, fe supplement and Percocet Condition: stable Instructions: refer to practice specific booklet Discharge to: home Follow-up Information   Follow up with Lenoard AdenAAVON,RICHARD J, MD In 6 weeks.   Specialty:  Obstetrics and Gynecology   Contact information:   [address] [place] [address] [phone]       Newborn Data:   [name], BoyA Reagen [[id number]][[id number]]  Live born female on [birth year] Birth Weight: 7 lb 2.1 oz (3235 g) APGAR: 9, [address]9   Genella MechLee, BoyB Arhianna [[id number]][[id number]]  Live born female on [birth year] Birth Weight: 7 lb 6 oz (3345 g) APGAR: 7, 9  Home with mother.  Trang Bouse K 06/25/2013, 8:15 AM

## 2013-06-25 NOTE — Lactation Note (Signed)
This note was copied from the chart of BoyA Tenleigh Bethune. Assisted baby A w/positioning for deeper latch. Observed infant w/sustained latch for 30 min. Observed swallows, respositioned x2, d/t pulling back, noted pinched nipple. Assisted in firm support w/pillows.Mother has comfort gels for tender nipples. No break down noted. Has good family support assistance. Has DEBP at home. Plan of care is as follows; to breast feed babies and supplement according AAP guidelines. Mother was encouraged to post-pump 20 min. At least 4 times QD. Discussed cont. Cue base feeding, cluster feeding, and need to do frequent STS. Reviewed engoragement prevention in Baby and Me book. Mother scheduled for LC outpt. Appt. F/u on 07/12/13 1:00pm. Mother was encouraged to page for latch assessment on baby B. 

## 2013-06-25 NOTE — Progress Notes (Signed)
Patient ID: [name] Stephens, female   DOB: January 20, 1986, 28 y.o.   MRN: [id number] POD # 3  Subjective: Pt reports feeling well and eager for d/c home/ Pain controlled with ibuprofen and rare percocet Tolerating po/Voiding without problems/ No n/v/Flatus pos Activity: out of bed and ambulate Bleeding is light Newborn info:  Information for the patient's newborn:  [name] Stephens, BoyA Stephens [[id number]][[id number]]  female Information for the patient's newborn:  [name] Stephens, [name] Stephens [[id number]][[id number]]  female  / circ performed by Dr [name] Stephens / Feeding: breast   Objective: VS: Blood pressure 127/85, pulse 92, temperature 98.5 F (36.9 C), temperature source Oral, resp. rate 17.   LABS:  Recent Labs  06/23/13 0615  WBC 10.1  HGB 9.3*  PLT 152                             Physical Exam:  General: alert, cooperative and no distress CV: Regular rate and rhythm Resp: clear Abdomen: soft, nontender, normal bowel sounds Incision: Covered with Tegaderm and honeycomb dressing; well approximated.  Uterine Fundus: firm, below umbilicus, nontender Lochia: minimal Ext: edema +1 to +2 pedal and Homans sign is negative, no sign of DVT    A/P: POD # 3/ G2 P2 1 0 2 Primary S/P Repeat C/Section d/t malpresentation of Twin B Doing well and stable for discharge home RX's: Ibuprofen 600mg  po Q 6 hrs prn pain #30 Refill x 1 Percocet 5/325 1 - 2 tabs po every 6 hrs prn pain  #30 No refill Colace 100mg  po up to TID prn #30 Ref x 1 Fe supplement daily Remove tegaderm dressing at home in 1 to 2 days Rt pp visit in 6 wks    Signed: Demetrius RevelFISHER,[name], MSN, [facility]NP 06/25/2013, 7:30 AM

## 2013-06-30 ENCOUNTER — Ambulatory Visit: Payer: Self-pay

## 2013-06-30 NOTE — Lactation Note (Signed)
This note was copied from the chart of [name] Stephens. Infant Lactation Consultation Outpatient Visit Note: Follow up visit with mom of 37 week twins using NS in hospital. Reports that she is mostly pumping and bottle feeding EBM. Hasn't put the babies to the breast in a few days. Babies are only EBM- no formula  Patient Name: [name] Zarr Date of Birth: 06/22/2012 Birth Weight:  7 lb 6 oz (3345 g) Gestational Age at Delivery: Gestational Age: [redacted]w[redacted]d Type of Delivery: C/S  Breastfeeding History Frequency of Breastfeeding: Mostly pump and bottle Length of Feeding:  Voids: Lots- had 2 voids while here Stools: Lots- had yellow stool while here  Supplementing / Method: Pumping:  Type of Pump: Medela- left hospital using manual pump   Frequency:q 3 hours  Volume:  4-5 oz  Comments:    Consultation Evaluation:  Initial Feeding Assessment: Pre-feed Weight: 6- 14.2  3126g Post-feed Weight: 6- 15.3  3154g Amount Transferred:28 cc's Comments: Attempted to latch [name] without the NS. He took several attempts then did latch and nurse.Was on and off the breast but nursed for 30 minutes total. Needed some stimulation to stay awake. Some swallows heard and mom reports that breast did soften some. Mom reports that it felt good but nipple did look slightly pinched when he came off the breast. Mom reports that her nipples were very sore but are better now. Has been using lanolin. Encouraged to feed about 30 minutes then if he is still hungry bottle feed EBM  :   Total Breast milk Transferred this Visit: 28 cc's Total Supplement Given: about 1 1/2 oz pumped milk by bottle  Additional Interventions: Mom asking about just pumping and bottle feeding- encouragement given that babies would be getting breast milk. She has help at home from dad and grandmother. Reports that she feels overwhelmed at times with two babies. No further questions at present. To call prn  Follow-Up  Made another appointment  here for Thursday 3/12 at 9 am.     Thai Burgueno D 06/30/2013, 4:03 PM   ;  

## 2013-06-30 NOTE — Lactation Note (Signed)
This note was copied from the chart of [name] Stephens. Infant Lactation Consultation Outpatient Visit Note: Follow up visit with mom of 37 week twins using NS when left hospital. Mom has been pumping and bottle feeding EBM for the last few days  Patient Name: [name] Oberman Date of Birth: 06/22/2012 Birth Weight:  7 lb 2.1 oz (3235 g) Gestational Age at Delivery: Gestational Age: [redacted]w[redacted]d Type of Delivery:   Breastfeeding History Frequency of Breastfeeding: pump and bottle Length of Feeding:  Voids: lots- had void while here for appointment Stools: lots- had stool while here for appointment  Supplementing / Method: Pumping:  Type of Pump: Medela   Frequency: q3 hours   Volume:  4-5 oz  Comments:    Consultation Evaluation:  Initial Feeding Assessment: Pre-feed Weight: 6-10.7  3026g Post-feed Weight: 6- 11.9  3058g Amount Transferred: 32 cc's Comments: [name] latched without NS better and stayed on better than Bennett. Mom reports that he has nursed better from the beginning. He nursed for 20 minutes. Some swallows heard and mom reports this breast feels softer than the other. Supplemented after feeding with EBM in bottle. Mom pleased it had gone so well. No questions at present. To call prn     Total Breast milk Transferred this Visit: 32  Total Supplement Given: about 1 oz EBM  Additional Interventions: To pump q 3 hours especially if babies are not nursing or the breast is not softer after they nurse.   Follow-Up  Appointment made here Thursday 3/12 at 10:30     Navreet Bolda D 06/30/2013, 4:32 PM    

## 2014-02-28 ENCOUNTER — Encounter (HOSPITAL_COMMUNITY): Payer: Self-pay | Admitting: *Deleted

## 2017-04-08 ENCOUNTER — Other Ambulatory Visit: Payer: Self-pay | Admitting: Internal Medicine

## 2017-04-08 DIAGNOSIS — R1031 Right lower quadrant pain: Secondary | ICD-10-CM

## 2017-04-09 ENCOUNTER — Other Ambulatory Visit: Payer: Self-pay | Admitting: Internal Medicine

## 2017-04-09 ENCOUNTER — Ambulatory Visit
Admission: RE | Admit: 2017-04-09 | Discharge: 2017-04-09 | Disposition: A | Discharge status: Home / Self Care | Source: Ambulatory Visit | Attending: Internal Medicine | Admitting: Internal Medicine

## 2017-04-09 DIAGNOSIS — R1031 Right lower quadrant pain: Secondary | ICD-10-CM | POA: Insufficient documentation

## 2017-04-09 DIAGNOSIS — K388 Other specified diseases of appendix: Secondary | ICD-10-CM | POA: Insufficient documentation

## 2017-04-09 DIAGNOSIS — K381 Appendicular concretions: Secondary | ICD-10-CM | POA: Insufficient documentation

## 2017-04-09 DIAGNOSIS — N8302 Follicular cyst of left ovary: Secondary | ICD-10-CM | POA: Insufficient documentation

## 2017-04-09 DIAGNOSIS — N8301 Follicular cyst of right ovary: Secondary | ICD-10-CM | POA: Insufficient documentation

## 2017-04-09 MED ORDER — IOHEXOL 240 MG/ML IJ SOLN
50.00 mL | Freq: Once | INTRAMUSCULAR | Status: AC | PRN
Start: 2017-04-09 — End: 2017-04-09
  Administered 2017-04-09: 08:00:00 50 mL via ORAL

## 2017-04-09 MED ORDER — IOHEXOL 350 MG/ML IV SOLN
100.00 mL | Freq: Once | INTRAVENOUS | Status: AC | PRN
Start: 2017-04-09 — End: 2017-04-09
  Administered 2017-04-09: 10:00:00 100 mL via INTRAVENOUS

## 2017-04-30 ENCOUNTER — Telehealth

## 2017-04-30 NOTE — Pre-Procedure Instructions (Signed)
Sent PSS email to [email or web address]

## 2017-05-06 NOTE — H&P (Addendum)
Patient seen and examined. H&P reviewed with patient.   No interval change from office exam that is currently scanned into Epic.      The ACS Risk Calculator was utilized to estimate the patient's chance of post-operative complications due to existing co-morbidities.  The results were presented to the patient in the pre-op holding area and discussed in detail.  All questions were answered and having understood, the patient agreed to proceed.      Below average risk.    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

## 2017-05-08 ENCOUNTER — Ambulatory Visit
Admission: RE | Admit: 2017-05-08 | Discharge: 2017-05-08 | Disposition: A | Discharge status: Home / Self Care | Source: Ambulatory Visit | Attending: Surgery | Admitting: Surgery

## 2017-05-08 ENCOUNTER — Ambulatory Visit: Admitting: Certified Registered"

## 2017-05-08 ENCOUNTER — Encounter
Admission: RE | Disposition: A | Discharge status: Home / Self Care | Payer: Self-pay | Source: Ambulatory Visit | Attending: Surgery

## 2017-05-08 ENCOUNTER — Ambulatory Visit: Payer: Self-pay

## 2017-05-08 DIAGNOSIS — J45909 Unspecified asthma, uncomplicated: Secondary | ICD-10-CM | POA: Insufficient documentation

## 2017-05-08 DIAGNOSIS — N808 Other endometriosis: Secondary | ICD-10-CM | POA: Insufficient documentation

## 2017-05-08 DIAGNOSIS — K429 Umbilical hernia without obstruction or gangrene: Secondary | ICD-10-CM | POA: Insufficient documentation

## 2017-05-08 DIAGNOSIS — K381 Appendicular concretions: Secondary | ICD-10-CM | POA: Diagnosis present

## 2017-05-08 DIAGNOSIS — R1031 Right lower quadrant pain: Secondary | ICD-10-CM | POA: Diagnosis present

## 2017-05-08 DIAGNOSIS — K3580 Unspecified acute appendicitis: Secondary | ICD-10-CM

## 2017-05-08 DIAGNOSIS — N80129 Deep endometriosis of ovary, unspecified ovary: Secondary | ICD-10-CM | POA: Diagnosis present

## 2017-05-08 HISTORY — DX: Depression, unspecified: F32.A

## 2017-05-08 HISTORY — DX: Anxiety disorder, unspecified: F41.9

## 2017-05-08 HISTORY — DX: Unspecified asthma, uncomplicated: J45.909

## 2017-05-08 HISTORY — DX: Headache, unspecified: R51.9

## 2017-05-08 LAB — HIV EXPOSURE FOR SOURCE PATIENTS, RAPID
Rapid HIV-1 p24 Antigen source patient: NONREACTIVE
Rapid HIV-1/2  Antibody source patient: NONREACTIVE

## 2017-05-08 LAB — PERSONNEL HEALTH HBSAG: Personel Health HBS AG: NONREACTIVE

## 2017-05-08 LAB — PERSONNEL HEALTH HCV ANTIBODY: Personel Health HCV Antibody: NONREACTIVE

## 2017-05-08 LAB — URINE BHCG POC: Urine bHCG POC: NEGATIVE

## 2017-05-08 SURGERY — LAPAROSCOPY, DIAGNOSTIC
Anesthesia: Anesthesia General | Site: Abdomen | Wound class: Contaminated

## 2017-05-08 MED ORDER — ACETAMINOPHEN 325 MG PO TABS
650.0000 mg | ORAL_TABLET | Freq: Once | ORAL | Status: DC | PRN
Start: 2017-05-08 — End: 2017-05-08

## 2017-05-08 MED ORDER — ONDANSETRON HCL 4 MG/2ML IJ SOLN
4.0000 mg | Freq: Once | INTRAMUSCULAR | Status: DC | PRN
Start: 2017-05-08 — End: 2017-05-08

## 2017-05-08 MED ORDER — ONDANSETRON HCL 4 MG/2ML IJ SOLN
INTRAMUSCULAR | Status: DC | PRN
Start: 2017-05-08 — End: 2017-05-08
  Administered 2017-05-08: 4 mg via INTRAVENOUS

## 2017-05-08 MED ORDER — SCOPOLAMINE 1 MG/3DAYS TD PT72
MEDICATED_PATCH | TRANSDERMAL | Status: AC
Start: 2017-05-08 — End: ?
  Filled 2017-05-08: qty 1

## 2017-05-08 MED ORDER — EPHEDRINE SULFATE 50 MG/ML IJ/IV SOLN (WRAP)
Status: AC
Start: 2017-05-08 — End: ?
  Filled 2017-05-08: qty 1

## 2017-05-08 MED ORDER — ACETAMINOPHEN 500 MG PO TABS
ORAL_TABLET | ORAL | Status: AC
Start: 2017-05-08 — End: ?
  Filled 2017-05-08: qty 2

## 2017-05-08 MED ORDER — ONDANSETRON HCL 4 MG PO TABS
4.0000 mg | ORAL_TABLET | Freq: Three times a day (TID) | ORAL | 0 refills | Status: AC | PRN
Start: 2017-05-08 — End: ?

## 2017-05-08 MED ORDER — MIDAZOLAM HCL 2 MG/2ML IJ SOLN
INTRAMUSCULAR | Status: AC
Start: 2017-05-08 — End: ?
  Filled 2017-05-08: qty 2

## 2017-05-08 MED ORDER — NEOSTIGMINE METHYLSULFATE 1 MG/ML IJ/IV SOLN (WRAP)
Status: AC
Start: 2017-05-08 — End: ?
  Filled 2017-05-08: qty 10

## 2017-05-08 MED ORDER — FENTANYL CITRATE (PF) 50 MCG/ML IJ SOLN (WRAP)
INTRAMUSCULAR | Status: AC
Start: 2017-05-08 — End: ?
  Filled 2017-05-08: qty 2

## 2017-05-08 MED ORDER — LIDOCAINE HCL (PF) 2 % IJ SOLN
INTRAMUSCULAR | Status: AC
Start: 2017-05-08 — End: ?
  Filled 2017-05-08: qty 5

## 2017-05-08 MED ORDER — GABAPENTIN 300 MG PO CAPS
ORAL_CAPSULE | ORAL | Status: AC
Start: 2017-05-08 — End: ?
  Filled 2017-05-08: qty 1

## 2017-05-08 MED ORDER — PROPOFOL 10 MG/ML IV EMUL (WRAP)
INTRAVENOUS | Status: DC | PRN
Start: 2017-05-08 — End: 2017-05-08
  Administered 2017-05-08: 200 mg via INTRAVENOUS

## 2017-05-08 MED ORDER — PROPOFOL 10 MG/ML IV EMUL (WRAP)
INTRAVENOUS | Status: AC
Start: 2017-05-08 — End: ?
  Filled 2017-05-08: qty 40

## 2017-05-08 MED ORDER — FENTANYL CITRATE (PF) 50 MCG/ML IJ SOLN (WRAP)
INTRAMUSCULAR | Status: AC
Start: 2017-05-08 — End: 2017-05-08
  Administered 2017-05-08: 10:00:00 25 ug via INTRAVENOUS
  Filled 2017-05-08: qty 2

## 2017-05-08 MED ORDER — BUPIVACAINE-EPINEPHRINE (PF) 0.25% -1:200000 IJ SOLN
INTRAMUSCULAR | Status: AC
Start: 2017-05-08 — End: ?
  Filled 2017-05-08: qty 30

## 2017-05-08 MED ORDER — GABAPENTIN 300 MG PO CAPS
600.0000 mg | ORAL_CAPSULE | Freq: Once | ORAL | Status: AC
Start: 2017-05-08 — End: 2017-05-08
  Administered 2017-05-08: 09:00:00 600 mg via ORAL

## 2017-05-08 MED ORDER — GLYCOPYRROLATE 0.2 MG/ML IJ SOLN
INTRAMUSCULAR | Status: AC
Start: 2017-05-08 — End: ?
  Filled 2017-05-08: qty 1

## 2017-05-08 MED ORDER — DEXAMETHASONE SODIUM PHOSPHATE 4 MG/ML IJ SOLN (WRAP)
INTRAMUSCULAR | Status: DC | PRN
Start: 2017-05-08 — End: 2017-05-08
  Administered 2017-05-08: 4 mg via INTRAVENOUS

## 2017-05-08 MED ORDER — ACETAMINOPHEN 500 MG PO TABS
1000.00 mg | ORAL_TABLET | Freq: Three times a day (TID) | ORAL | 0 refills | Status: AC
Start: 2017-05-08 — End: ?

## 2017-05-08 MED ORDER — NEOSTIGMINE METHYLSULFATE 1 MG/ML IJ/IV SOLN (WRAP)
Status: DC | PRN
Start: 2017-05-08 — End: 2017-05-08
  Administered 2017-05-08: 4 mg via INTRAVENOUS

## 2017-05-08 MED ORDER — LABETALOL HCL 5 MG/ML IV SOLN (WRAP)
20.0000 mg | Freq: Once | INTRAVENOUS | Status: DC | PRN
Start: 2017-05-08 — End: 2017-05-08

## 2017-05-08 MED ORDER — DEXAMETHASONE SODIUM PHOSPHATE 4 MG/ML IJ SOLN
INTRAMUSCULAR | Status: AC
Start: 2017-05-08 — End: ?
  Filled 2017-05-08: qty 1

## 2017-05-08 MED ORDER — NAPROXEN 250 MG PO TABS
250.00 mg | ORAL_TABLET | Freq: Three times a day (TID) | ORAL | 0 refills | Status: AC
Start: 2017-05-08 — End: 2017-05-11

## 2017-05-08 MED ORDER — CEFAZOLIN SODIUM 1 G IJ SOLR
2.0000 g | INTRAMUSCULAR | Status: AC
Start: 2017-05-08 — End: 2017-05-08
  Administered 2017-05-08: 09:00:00 2 g via INTRAVENOUS

## 2017-05-08 MED ORDER — LORAZEPAM 2 MG/ML IJ SOLN
0.5000 mg | Freq: Once | INTRAMUSCULAR | Status: AC
Start: 2017-05-08 — End: 2017-05-08

## 2017-05-08 MED ORDER — LIDOCAINE-EPINEPHRINE 1 %-1:100000 IJ SOLN
INTRAMUSCULAR | Status: AC
Start: 2017-05-08 — End: ?
  Filled 2017-05-08: qty 20

## 2017-05-08 MED ORDER — LORAZEPAM 2 MG/ML IJ SOLN
INTRAMUSCULAR | Status: AC
Start: 2017-05-08 — End: 2017-05-08
  Administered 2017-05-08: 11:00:00 0.5 mg via INTRAVENOUS
  Filled 2017-05-08: qty 1

## 2017-05-08 MED ORDER — MIDAZOLAM HCL 2 MG/2ML IJ SOLN
INTRAMUSCULAR | Status: DC | PRN
Start: 2017-05-08 — End: 2017-05-08
  Administered 2017-05-08: 2 mg via INTRAVENOUS

## 2017-05-08 MED ORDER — FENTANYL CITRATE (PF) 50 MCG/ML IJ SOLN (WRAP)
INTRAMUSCULAR | Status: DC | PRN
Start: 2017-05-08 — End: 2017-05-08
  Administered 2017-05-08: 100 ug via INTRAVENOUS

## 2017-05-08 MED ORDER — OXYCODONE HCL 5 MG PO TABS
5.0000 mg | ORAL_TABLET | Freq: Once | ORAL | Status: AC | PRN
Start: 2017-05-08 — End: 2017-05-08

## 2017-05-08 MED ORDER — ROCURONIUM BROMIDE 50 MG/5ML IV SOLN
INTRAVENOUS | Status: AC
Start: 2017-05-08 — End: ?
  Filled 2017-05-08: qty 5

## 2017-05-08 MED ORDER — LIDOCAINE HCL 2 % IJ SOLN
INTRAMUSCULAR | Status: DC | PRN
Start: 2017-05-08 — End: 2017-05-08
  Administered 2017-05-08: 100 mg via INTRAVENOUS

## 2017-05-08 MED ORDER — CEFAZOLIN SODIUM 1 G IJ SOLR
INTRAMUSCULAR | Status: AC
Start: 2017-05-08 — End: ?
  Filled 2017-05-08: qty 2000

## 2017-05-08 MED ORDER — ROCURONIUM BROMIDE 10 MG/ML IV SOLN (WRAP)
INTRAVENOUS | Status: DC | PRN
Start: 2017-05-08 — End: 2017-05-08
  Administered 2017-05-08: 35 mg via INTRAVENOUS
  Administered 2017-05-08: 15 mg via INTRAVENOUS

## 2017-05-08 MED ORDER — TRAMADOL HCL 50 MG PO TABS
50.00 mg | ORAL_TABLET | Freq: Four times a day (QID) | ORAL | 0 refills | Status: AC | PRN
Start: 2017-05-08 — End: ?

## 2017-05-08 MED ORDER — HYDRALAZINE HCL 20 MG/ML IJ SOLN
10.0000 mg | Freq: Once | INTRAMUSCULAR | Status: DC | PRN
Start: 2017-05-08 — End: 2017-05-08

## 2017-05-08 MED ORDER — SCOPOLAMINE 1 MG/3DAYS TD PT72
1.00 | MEDICATED_PATCH | TRANSDERMAL | Status: DC
Start: 2017-05-08 — End: 2017-05-08
  Administered 2017-05-08 (×2): 1 via TRANSDERMAL

## 2017-05-08 MED ORDER — GLYCOPYRROLATE 0.2 MG/ML IJ SOLN
INTRAMUSCULAR | Status: DC | PRN
Start: 2017-05-08 — End: 2017-05-08
  Administered 2017-05-08: .6 mg via INTRAVENOUS
  Administered 2017-05-08: 0.2 mg via INTRAVENOUS

## 2017-05-08 MED ORDER — LACTATED RINGERS IV SOLN
INTRAVENOUS | Status: DC | PRN
Start: 2017-05-08 — End: 2017-05-08

## 2017-05-08 MED ORDER — HYDROMORPHONE HCL 1 MG/ML IJ SOLN
0.5000 mg | INTRAMUSCULAR | Status: DC | PRN
Start: 2017-05-08 — End: 2017-05-08

## 2017-05-08 MED ORDER — CELECOXIB 200 MG PO CAPS
ORAL_CAPSULE | ORAL | Status: AC
Start: 2017-05-08 — End: ?
  Filled 2017-05-08: qty 1

## 2017-05-08 MED ORDER — BUPIVACAINE-EPINEPHRINE (PF) 0.25% -1:200000 IJ SOLN
INTRAMUSCULAR | Status: DC | PRN
Start: 2017-05-08 — End: 2017-05-08
  Administered 2017-05-08: 29 mL

## 2017-05-08 MED ORDER — CELECOXIB 200 MG PO CAPS
200.0000 mg | ORAL_CAPSULE | Freq: Once | ORAL | Status: AC
Start: 2017-05-08 — End: 2017-05-08
  Administered 2017-05-08: 09:00:00 200 mg via ORAL

## 2017-05-08 MED ORDER — FENTANYL CITRATE (PF) 50 MCG/ML IJ SOLN (WRAP)
25.0000 ug | INTRAMUSCULAR | Status: AC | PRN
Start: 2017-05-08 — End: 2017-05-08
  Administered 2017-05-08 (×3): 25 ug via INTRAVENOUS

## 2017-05-08 MED ORDER — ACETAMINOPHEN 500 MG PO TABS
1000.0000 mg | ORAL_TABLET | Freq: Once | ORAL | Status: AC
Start: 2017-05-08 — End: 2017-05-08
  Administered 2017-05-08: 09:00:00 1000 mg via ORAL

## 2017-05-08 MED ORDER — PROMETHAZINE HCL 25 MG/ML IJ SOLN
6.2500 mg | Freq: Once | INTRAMUSCULAR | Status: DC | PRN
Start: 2017-05-08 — End: 2017-05-08

## 2017-05-08 MED ORDER — OXYCODONE HCL 5 MG PO TABS
ORAL_TABLET | ORAL | Status: AC
Start: 2017-05-08 — End: 2017-05-08
  Administered 2017-05-08: 12:00:00 5 mg via ORAL
  Filled 2017-05-08: qty 1

## 2017-05-08 MED ORDER — ONDANSETRON HCL 4 MG/2ML IJ SOLN
INTRAMUSCULAR | Status: AC
Start: 2017-05-08 — End: ?
  Filled 2017-05-08: qty 2

## 2017-05-08 MED ORDER — EPHEDRINE SULFATE 50 MG/ML IJ/IV SOLN (WRAP)
Status: DC | PRN
Start: 2017-05-08 — End: 2017-05-08
  Administered 2017-05-08: 10 mg via INTRAVENOUS

## 2017-05-08 MED ORDER — GLYCOPYRROLATE 0.2 MG/ML IJ SOLN
INTRAMUSCULAR | Status: AC
Start: 2017-05-08 — End: ?
  Filled 2017-05-08: qty 2

## 2017-05-08 SURGICAL SUPPLY — 83 items
ADHESIVE SKIN CLOSURE DERMABOND ADVANCED (Skin Closure) ×1
ADHESIVE SKIN CLOSURE DERMABOND ADVANCED .7 ML LIQUID APPLICATOR (Skin Closure) ×1 IMPLANT
ADHESIVE SKIN CLOSURE DERMABOND PRINEO (Skin Closure) ×1
ADHESIVE SKIN CLOSURE DERMABOND PRINEO LIQUID 2-OCTYL CYANOACRYLATE (Skin Closure) IMPLANT
ADHESIVE SKNCLS 2 OCTYL CYNCRLT .7ML (Skin Closure) ×1
ADHESIVE SKNCLS 2-OCTYL CYNCRLT DRMBND (Skin Closure) ×1
APPLIER IN CLP TI MED LG LIGAMAX 5MM (Endoscopic Supplies)
APPLIER INTERNAL CLIP MEDIUM LARGE (Endoscopic Supplies)
APPLIER INTERNAL CLIP MEDIUM LARGE TITANIUM ANGLE JAW DISTAL TIP CLOSE (Endoscopic Supplies) ×1 IMPLANT
CANULA STABILITY 5MM (Procedure Accessories) ×4 IMPLANT
CATHETER FOLEY KT 16FR (Catheter Micellaneous)
CATHETER FOLEY KT 16FR (Catheter Miscellaneous) IMPLANT
CAUTERY MONOPOLAR (Cautery) ×2 IMPLANT
CLEANER INST 6ML MED PREKLN SOAK SHLD (Sterilization Supply)
CLEANER INSTRUMENT 6 ML SOAK SHIELD (Sterilization Supply)
CLEANER INSTRUMENT 6 ML SOAK SHIELD PRECLEAN TIP PROTECTION MEDIUM (Sterilization Supply) ×1 IMPLANT
CLIP ENDO 10 MM (Laparoscopy Supplies) IMPLANT
DRAPE SRG TBRN CNVRT 121.5X106X77IN LF (Drape)
DRAPE SURGICAL REINFORCE FENESTRATE L121.5 IN X W106 IN X H77 IN (Drape) ×1 IMPLANT
GLOVE SURG BIOGEL PF LTX SZ7.0 (Glove) ×2 IMPLANT
GLOVE SURG BIOGEL SZ6.5 (Glove) ×2 IMPLANT
GOWN SRG LG SMARTGOWN LF STRL LVL 4 (Gown) ×2
GOWN SRGCL LG LEVEL 4 BRTHBL STRL LF DSPSBL SMARTGOWN (Gown) ×1 IMPLANT
INSTR DISSECT BLUNT (Instrument) ×2 IMPLANT
PACK GEN LAPAROSCOPY FFX (Pack) ×2 IMPLANT
PACK MINOR (Pack) ×1 IMPLANT
PAD ELECTROSRG GRND REM W CRD (Procedure Accessories) ×2 IMPLANT
POUCH SPEC RTRVL PU E-CTCH GLD 10MM 34.5 (Laparoscopy Supplies) ×1
POUCH SPECIMEN RETRIEVAL L34.5 CM (Laparoscopy Supplies) ×1
POUCH SPECIMEN RETRIEVAL L34.5 CM ERGONOMIC HANDLE LONG CYLINDRICAL (Laparoscopy Supplies) ×1 IMPLANT
RELOAD STAPLER 2 MM 2.5 MM 3 MM L45 MM (Staplers)
RELOAD STAPLER 2 MM 2.5 MM 3 MM L45 MM ENDO GIA TITANIUM MEDIUM (Staplers) IMPLANT
RELOAD STAPLER 2 MM 2.5 MM 3 MM L60 MM (Staplers)
RELOAD STAPLER 2 MM 2.5 MM 3 MM L60 MM ENDO GIA TITANIUM MEDIUM (Staplers) ×1 IMPLANT
RELOAD STAPLER 3 MM 3.5 MM 4 MM L45 MM (Staplers)
RELOAD STAPLER 3 MM 3.5 MM 4 MM L45 MM ENDO GIA TITANIUM MEDIUM THICK (Staplers) IMPLANT
RELOAD STPLR TI 2MM 2.5MM 3MM EGIA 45MM (Staplers)
RELOAD STPLR TI 2MM 2.5MM 3MM EGIA 60MM (Staplers)
RELOAD STPLR TI 3MM 3.5MM 4MM EGIA 45MM (Staplers)
SHEAR CURVD ERGO HNDLE 36CM (Cautery) IMPLANT
SHEAR SCALP HARM ACE (Cautery) ×2 IMPLANT
SOL IRR 0.9% NACL 500ML PLS PR BTL ISTNC (Irrigation Solutions) ×1
SOLUTION IRRIGATION 0.9% SDM CHLORIDE 500ML PR BTTL ISOTONIC NONPRGNC (Irrigation Solutions) ×1 IMPLANT
SOLUTION IRRIGATION 0.9% SODIUM CHLORIDE (Irrigation Solutions) ×1
SOLUTION IV 0.9% NACL 1000ML VFLX LF PLS (IV Solutions)
SOLUTION IV 0.9% SODIUM CHLORIDE PVC (IV Solutions)
SOLUTION IV 0.9% SODIUM CHLORIDE PVC 1000 ML PH 5 PLASTIC CONTAINER (IV Solutions) ×1 IMPLANT
SPONGE CHLRPRP TINT 26ML (Applicator) ×2 IMPLANT
STAPLER INTNL PVC STD UNV EGIA 12MM (Staplers) ×1
STAPLER TISSUE L16 CM X W4 MM OD12 MM (Staplers) ×1
STAPLER TISSUE L16 CM X W4 MM OD12 MM ENDOSCOPIC ENDO GIA PVC INTERNAL (Staplers) ×1 IMPLANT
SUCTION IRRIGATOR 2 WITH TIP (Suction) IMPLANT
SUTURE ABS 2-0 CT1 VCL 27IN BRD COAT (Suture) ×1
SUTURE ABS 2-0 SH VCL 27IN BRD COAT VIOL (Suture)
SUTURE ABS 3-0 SH VCL 27IN BRD COAT UD (Suture) ×1
SUTURE ABS 3-0 SH VCL 27IN BRD COAT VIOL (Suture)
SUTURE COATED VICRYL 2-0 CT-1 L27 IN (Suture) ×1
SUTURE COATED VICRYL 2-0 CT-1 L27 IN BRAID COATED VIOLET ABSORBABLE (Suture) IMPLANT
SUTURE COATED VICRYL 2-0 SH L27 IN BRAID (Suture)
SUTURE COATED VICRYL 2-0 SH L27 IN BRAID COATED VIOLET ABSORBABLE (Suture) ×1 IMPLANT
SUTURE COATED VICRYL 3-0 SH L27 IN BRAID (Suture) IMPLANT
SUTURE COATED VICRYL 3-0 SH L27 IN BRAID COATED VIOLET ABSORBABLE (Suture) ×1 IMPLANT
SUTURE ETHIBOND EXCEL 0 CT-1 L30 IN (Suture) ×1
SUTURE ETHIBOND EXCEL 0 CT-1 L30 IN BRAID NONABSORBABLE (Suture) IMPLANT
SUTURE MONOCRYL 4-0 PS2 27IN (Suture) ×2 IMPLANT
SUTURE NABSB 0 CT1 EBND EXC 30IN BRD (Suture) ×1
SUTURE VICRYL 0 UR6 27IN (Suture) ×5 IMPLANT
SUTURE VICRYL 2-0 SH 27IN (Suture) ×1 IMPLANT
SYSTEM IMAGING 8X6IN CLEARIFY MICROFIBER WARM HUB TRCR WIPE DSPSBL (Kits) IMPLANT
SYSTEM IMG MRFBR CLEARIFY 8X6IN WRM HUB (Kits)
SYSTEM SMKEVC UNV PLUMEPORT ACTIV PLUME (Filter) ×2
SYSTEM SMOKE EVACUATOR LAPAROSCOPIC PLUME FILTRATION LIGHTWEIGHT LUER (Filter) ×1 IMPLANT
TIP SCT TPR BLBS MDVC YNKR LF STRL DISP (Procedure Accessories) ×2
TIP SUCTION MEDI-VAC YANKAUER TAPER BULBOUS CLEAR (Procedure Accessories) IMPLANT
TRAY SURESTEP CATH URN LF 14FR (Catheter Urine) IMPLANT
TROCAR BLADELESS ENDO 5X100MM (Laparoscopy Supplies) ×2 IMPLANT
TROCAR ENDOPATH XCEL BLUNT TIP (Laparoscopy Supplies) ×2 IMPLANT
TROCAR XCEL ENDO TIP 12MM (Laparoscopy Supplies) IMPLANT
TUBING INSUFFLATION LUER CONNECTOR FILTER HIGH FLOW CLEANFLOW SU1101 (Respiratory Supplies) ×1 IMPLANT
TUBING INSUFFLATION W/CO2 GUAR (Respiratory Supplies) ×2
TUBING SCT MDVC MXGR 9/32IN 12FT LF STRL (Suction) ×1
TUBING SUCTION ID9/32 IN L12 FT (Suction) ×1
TUBING SUCTION ID9/32 IN L12 FT NONCONDUCTIVE MALE TO MALE CONNECTOR (Suction) IMPLANT

## 2017-05-08 NOTE — Discharge Instructions (Signed)

## 2017-05-08 NOTE — Op Note (Signed)
Diagnostic laparoscopy, appendectomy and RLQ subfascial mass Excision, Procedure Note    Indications: 32yo F with RLQ subfascial endometrioma and fecalith noted in appendix tip.     Pre-operative Diagnosis: abdnormal appearing appendix with fecalith, endometrioma    Post-operative Diagnosis: same    Surgeon: Valeria Batman, MD    Assistants: Lorel Monaco. Pernell Dupre, MD    Anesthesia: General endotracheal anesthesia    ASA Class: 2      Procedure Details   After appropriate consent was obtained, the patient was brought to the operating room and placed in the supine position. With continuous pulse oximetry and cardiac monitoring, and with sequential compression devices placed, she was given a general anesthetic and prepped and draped in the usual sterile fashion. Following the surgical pause, a standard periumbilical laparoscopic incision was made and carried to the fascia which was incised under direct vision. The peritoneal cavity was then entered under direct vision as well, taking care not to damage any structures underneath the peritoneum. 0-Vicryl suture was then placed in the fascia to secure the Hasson trocar which was then placed into the abdomen under direct vision. After visual confirmation of intraabdominal placement was made with the camera, a pneumoperitoneum was created. After a brief review of the abdomen revealed no other pathology, two 5 mm ports were placed under direct vision, one in the left lower quadrant, and one in the suprapubic position, again taking great care to avoid injury to the viscera. The patient was placed in the slight trandelenberg position. Dissection around the cecum revealed the appendix with dilated tip and small appendiceal mesenteric nodule.The mesentery was divided using the ultrasonic dissector, assuring meticulous hemostasis and including the mesenteric nodule. The endo stapler was then utilized across the base of the appendix. Integrity of the staple line and meticulous  hemostasis was again noted. The appendix was placed in the pouch, removed, and sent as specimen. There was no spillage and no bleeding, and the stump of the appendix and mesoappendix again reviewed and found to be intact. The patient was returned to the supine position. Next, we turned our attention to the RLQ subfascial mass. Pneumoperitoneum was maintained as the mass could be easily palpated. A 4cm incision was made over the mass at the planned and marked area after local anesthetic was given. The mass was excised using electrocautery. A 3.5x2.5cm subfascial mass, suspicious for endometrioma was excised and handed off the field as specimen. The fascial defect was closed using 0ethibond figure of eight sutures. The deep dermis was reapproximated with 3-0vicryl and the skin was closed with 4-0 monocryl. Next we evacuated the pneumoperiteum and removed the ports and specimen bag. A small umbilical hernia was noted and repaired using 0ethibond figure of eight sutures. The previously placed fascial suture was then tied, and one additional 0-Vicryl fascial suture was placed.The deep dermis closed with 3-0vicryl and skin with 4-0 monocryl. The 5mm port site incisions were closed with 4-0 monocryl. Dermabond applied.  The patient tolerated the procedure well and left for the recovery room in good condition.  Dr. Ned Grace was present throughout the case      Findings:  2.5x3.5cm subfascial mass, suspect endometrioma  Tip appendix dilated  Small nodule on appendiceal mesentery excised    Estimated Blood Loss: minimal     Drains: NONE     Total IV Fluids: per anesthesia     Specimens: appendix, appendiceal mesenteric nodule  2.5x3.5cm subfascial mass,     Complications: None; patient tolerated the procedure well.  Disposition: PACU - hemodynamically stable.     Condition: stable

## 2017-05-08 NOTE — Discharge Instr - AVS First Page (Addendum)
SURGICAL DISCHARGE INSTRUCTIONS    DIAGNOSIS: appendicolith, endometrioma  PROCEDURE: diagnostic laparoscopy, appendectomy endometrioma excision    SURGEON: Dr Ned Grace, [facility] -- [phone]    Wound care:  Keep your incision clean and dry. You may shower anytime (the skin glue covering your incision(s) is waterproof). Do not soak in a tub or go swimming for 2 weeks.    Pain Control:  Some pain and swelling is normal after surgery.  You may take the pain medication (roxicodone) as prescribed.  You may also ice your incision with an ice pack wrapped in a towel for 20 min as needed.    You may also use over the counter Advil, Motrin, or Aleve instead of/in addition to the roxicodone. You may also use plain Tylenol instead of, or in addition to, the roxicodone. The maximum daily dose of Tylenol FROM ALL SOURCES is 4000 mg daily. The recommended dosing schedule is 1000 mg every 6-8 hours.    A common side effect of roxicodone is constipation. You may use any over the counter stool softener or bulking agent to prevent this (Metamucil, Miralax, Colace, prune juice, etc).    Activity:   You may resume your regular daily activities as tolerated, with the following exceptions:  -- Do not drive if you are taking roxicodone  -- Do not strain or lift more than 10 pounds for 3 weeks  -- Do not resume exercise or return to work/school until you are cleared by Dr Ned Grace    Diet:   You may not feel hungry all them time or you may eat a small amount and feel full.  This is ok.  Some people find eating multiple small meals is easier than 3 large meals.  Continue to drink lots of clear fluids.    Reasons to call the doctor:  Call your doctor or go to emergency room if you have any of the following:   Fever greater than 101.5 F   Pus draining from your wound   Redness around your wound that is spreading   Pain not controlled with medications   Chest pain   Shortness of breath   Nausea/vomiting     Inability to empty your bladder      Post Anesthesia Discharge Instructions    Although you may be awake and alert in the recovery room, small amounts of anesthetic remain in your system for about 24 hours.  You may feel tired and sleepy during this time.      You are advised to go directly home from the hospital.    Plan to stay at home and rest for the remainder of the day.    It is advisable to have someone with you at home for 24 hours after surgery.    Do not operate a motor vehicle, or any mechanical or electrical equipment for the next 24 hours.      Be careful when you are walking around, you may become dizzy.  The effects of anesthesia and/or medications are still present and drowsiness may occur    Do not consume alcohol, tranquilizers, sleeping medications, or any other non prescribed medication for the remainder of the day.    Diet:  begin with liquids, progress your diet as tolerated or as directed by your surgeon.  Nausea and vomiting may occur in the next 24 hours.

## 2017-05-08 NOTE — Anesthesia Postprocedure Evaluation (Signed)
Anesthesia Post Evaluation    Patient: Kaitlyn Thomas    Procedure(s):  LAPAROSCOPY, DIAGNOSTIC  LAPAROSCOPIC, APPENDECTOMY  EXCISION, RIGHT LOWER ABDOMEN ENDOMETRIOMA    Anesthesia type: general    Last Vitals:   Vitals:    05/08/17 1145   BP: 111/71   Pulse: 65   Resp: 16   Temp:    SpO2: 100%       Anesthesia Post Evaluation    Patient location during evaluation: bedside  Patient participation: complete - patient participated  Level of consciousness: awake  Pain score: 3  Pain management: adequate  Airway patency: patent  Anesthetic complications: no  Cardiovascular status: acceptable  Respiratory status: acceptable  Hydration status: acceptable        Signed by: Tali Cleaves Kabiruddin Aquarius Tremper, 05/08/2017 12:01 PM

## 2017-05-08 NOTE — H&P (Signed)
The H&P is written by Dr. Ned Grace on 18/19. No new updates. Plan for dx lap, appendectomy and excision of endometrioma    Exam:  BP 107/68   Pulse 68   Temp 97.9 F (36.6 C) (Temporal Artery)   Resp 16   Ht 1.727 m (5\' 8" )   Wt 90.9 kg (200 lb 8 oz)   LMP 04/06/2017 (Approximate)   SpO2 99%   BMI 30.49 kg/m   Respiratory: chest clear, no wheezing, rales, normal symmetric air entry  Cardiac: RRR with normal S1 and S2 ,no murmurs, no gallops  Abd: soft, NT, RLQ small 2cm mass palpated    Consents reviewed and signed by patient, all questions were answered. Proceed with operative plan of diagnostic laparoscopy, appendectomy and excision of endometrioma of RLQ abdominal wal.

## 2017-05-08 NOTE — Anesthesia Preprocedure Evaluation (Signed)
Anesthesia Evaluation    AIRWAY    Mallampati: II    TM distance: >3 FB  Neck ROM: full  Mouth Opening:full   CARDIOVASCULAR    cardiovascular exam normal       DENTAL    no notable dental hx     PULMONARY    pulmonary exam normal     OTHER FINDINGS                  Relevant Problems   No relevant active problems               Anesthesia Plan    ASA 2     general               (Chart reviewed, patient examined. Questions answered.  Risks, benefits and alternatives discussed with patient.  Patient states understanding and desires to proceed with anesthetic plan. )      intravenous induction   Detailed anesthesia plan: general endotracheal        Post op pain management: per surgeon    informed consent obtained                   Signed by: Aveer Bartow Kabiruddin Pervis Macintyre 05/08/17 8:24 AM

## 2017-05-08 NOTE — Transfer of Care (Signed)
Anesthesia Transfer of Care Note    Patient: Kaitlyn Thomas    Procedures performed: Procedure(s):  LAPAROSCOPY, DIAGNOSTIC  LAPAROSCOPIC, APPENDECTOMY  EXCISION, RIGHT LOWER ABDOMEN TUMOR    Anesthesia type: General ETT    Patient location:Phase I PACU    Last vitals:   Vitals:    05/08/17 1021   BP: 119/70   Pulse: 76   Resp: 18   Temp: 36.2 C (97.1 F)   SpO2: 99%       Post pain: Patient not complaining of pain, continue current therapy      Mental Status:awake and alert     Respiratory Function: tolerating nasal cannula    Cardiovascular: stable    Nausea/Vomiting: patient not complaining of nausea or vomiting    Hydration Status: adequate    Post assessment: no apparent anesthetic complications, no reportable events and no evidence of recall    Signed by: Teena Irani  05/08/17 10:21 AM

## 2017-05-12 ENCOUNTER — Encounter: Payer: Self-pay | Admitting: Surgery

## 2017-05-13 LAB — LAB USE ONLY - HISTORICAL SURGICAL PATHOLOGY

## 2019-02-19 ENCOUNTER — Telehealth (HOSPITAL_BASED_OUTPATIENT_CLINIC_OR_DEPARTMENT_OTHER): Payer: Self-pay

## 2019-02-19 NOTE — Telephone Encounter (Signed)
Abdominal Pain  Patient complains of pain in the RLQ for the last 3 days. Symptoms have been unchanged. The pain is described as dull, is  mild, and does not radiate.  Associated symptoms include none. Nocturnal pain is not present.   Aggravating factors include activity.  Alleviating factors include none.   Pt reports she had an endometrioma which was 04/2017 removed in the same area as well as appendectomy. She is scheduled for a confirmation on 11/2 but asks if this is normal or not, she asked if it could be from stretching due to the pregnancy.

## 2019-02-23 NOTE — Telephone Encounter (Signed)
Pt reports that she decided to use another practice at this time.

## 2019-02-23 NOTE — Telephone Encounter (Signed)
This can be normal but she is also at risk for ectopic. I recommend she have ultrasound at hospital to confirm pregnancy and rule out ectopic prior to her visit. Looks like she was scheduled for 03/15/19 bu cancelled it. Is she planning to still see [place] or another practice?  If so I will order a STAT ultrasound. Please call patient to find out.    Dr. Mayford Knife

## 2019-03-15 ENCOUNTER — Encounter (HOSPITAL_BASED_OUTPATIENT_CLINIC_OR_DEPARTMENT_OTHER): Admitting: Obstetrics & Gynecology

## 2019-07-28 ENCOUNTER — Other Ambulatory Visit: Payer: Self-pay | Admitting: Family Medicine

## 2019-07-28 ENCOUNTER — Ambulatory Visit
Admission: RE | Admit: 2019-07-28 | Discharge: 2019-07-28 | Disposition: A | Discharge status: Home / Self Care | Source: Ambulatory Visit | Attending: Family Medicine | Admitting: Family Medicine

## 2019-07-28 DIAGNOSIS — M545 Low back pain, unspecified: Secondary | ICD-10-CM

## 2019-08-30 ENCOUNTER — Other Ambulatory Visit: Payer: Self-pay | Admitting: Family Medicine

## 2019-08-30 DIAGNOSIS — M545 Low back pain, unspecified: Secondary | ICD-10-CM

## 2019-09-14 ENCOUNTER — Ambulatory Visit
Admission: RE | Admit: 2019-09-14 | Discharge: 2019-09-14 | Disposition: A | Discharge status: Home / Self Care | Source: Ambulatory Visit | Attending: Family Medicine | Admitting: Family Medicine

## 2019-09-14 DIAGNOSIS — M545 Low back pain, unspecified: Secondary | ICD-10-CM

## 2019-09-14 DIAGNOSIS — M5116 Intervertebral disc disorders with radiculopathy, lumbar region: Secondary | ICD-10-CM | POA: Insufficient documentation

## 2019-09-14 DIAGNOSIS — M5117 Intervertebral disc disorders with radiculopathy, lumbosacral region: Secondary | ICD-10-CM | POA: Insufficient documentation
